# Patient Record
Sex: Male | Born: 1940
Health system: Southern US, Community
[De-identification: ages and names within clinical notes are randomized; demographics above are authoritative.]

## PROBLEM LIST (undated history)

## (undated) DIAGNOSIS — M1990 Unspecified osteoarthritis, unspecified site: Secondary | ICD-10-CM

## (undated) DIAGNOSIS — G8929 Other chronic pain: Secondary | ICD-10-CM

## (undated) DIAGNOSIS — R269 Unspecified abnormalities of gait and mobility: Secondary | ICD-10-CM

## (undated) DIAGNOSIS — I739 Peripheral vascular disease, unspecified: Secondary | ICD-10-CM

## (undated) DIAGNOSIS — I10 Essential (primary) hypertension: Secondary | ICD-10-CM

## (undated) DIAGNOSIS — E7800 Pure hypercholesterolemia, unspecified: Secondary | ICD-10-CM

## (undated) DIAGNOSIS — D510 Vitamin B12 deficiency anemia due to intrinsic factor deficiency: Secondary | ICD-10-CM

## (undated) DIAGNOSIS — M549 Dorsalgia, unspecified: Secondary | ICD-10-CM

## (undated) DIAGNOSIS — E1142 Type 2 diabetes mellitus with diabetic polyneuropathy: Secondary | ICD-10-CM

## (undated) DIAGNOSIS — N400 Enlarged prostate without lower urinary tract symptoms: Secondary | ICD-10-CM

## (undated) DIAGNOSIS — E1143 Type 2 diabetes mellitus with diabetic autonomic (poly)neuropathy: Secondary | ICD-10-CM

## (undated) HISTORY — PX: CERVICAL DISCECTOMY: SHX98

## (undated) HISTORY — DX: Dorsalgia, unspecified: M54.9

## (undated) HISTORY — DX: Vitamin B12 deficiency anemia due to intrinsic factor deficiency: D51.0

## (undated) HISTORY — DX: Unspecified abnormalities of gait and mobility: R26.9

## (undated) HISTORY — PX: TOTAL HIP ARTHROPLASTY: SHX124

## (undated) HISTORY — DX: Essential (primary) hypertension: I10

## (undated) HISTORY — DX: Pure hypercholesterolemia, unspecified: E78.00

## (undated) HISTORY — PX: THORACIC DISCECTOMY: SHX6113

## (undated) HISTORY — DX: Type 2 diabetes mellitus with diabetic autonomic (poly)neuropathy: E11.43

## (undated) HISTORY — DX: Peripheral vascular disease, unspecified: I73.9

## (undated) HISTORY — DX: Benign prostatic hyperplasia without lower urinary tract symptoms: N40.0

## (undated) HISTORY — DX: Other chronic pain: G89.29

## (undated) HISTORY — DX: Unspecified osteoarthritis, unspecified site: M19.90

## (undated) HISTORY — PX: LAMINECTOMY: SHX219

## (undated) HISTORY — PX: UMBILICAL HERNIA REPAIR: SHX196

---
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
## 1999-12-04 ENCOUNTER — Encounter: Payer: Self-pay | Admitting: Orthopedic Surgery

## 1999-12-05 ENCOUNTER — Ambulatory Visit (HOSPITAL_COMMUNITY): Admission: RE | Admit: 1999-12-05 | Discharge: 1999-12-05 | Payer: Self-pay | Admitting: Orthopedic Surgery

## 2003-06-24 ENCOUNTER — Encounter: Admission: RE | Admit: 2003-06-24 | Discharge: 2003-06-24 | Payer: Self-pay | Admitting: Orthopedic Surgery

## 2003-06-24 ENCOUNTER — Encounter: Payer: Self-pay | Admitting: Orthopedic Surgery

## 2009-05-17 ENCOUNTER — Encounter: Admission: RE | Admit: 2009-05-17 | Discharge: 2009-05-17 | Payer: Self-pay | Admitting: Orthopedic Surgery

## 2009-08-17 ENCOUNTER — Encounter: Admission: RE | Admit: 2009-08-17 | Discharge: 2009-08-17 | Payer: Self-pay | Admitting: Orthopedic Surgery

## 2010-03-16 ENCOUNTER — Encounter: Admission: RE | Admit: 2010-03-16 | Discharge: 2010-03-16 | Payer: Self-pay | Admitting: Orthopedic Surgery

## 2011-06-11 ENCOUNTER — Inpatient Hospital Stay (HOSPITAL_COMMUNITY)
Admission: RE | Admit: 2011-06-11 | Discharge: 2011-06-21 | DRG: 945 | Disposition: A | Discharge status: Home / Self Care | Payer: No Typology Code available for payment source | Source: Other Acute Inpatient Hospital | Attending: Physical Medicine & Rehabilitation | Admitting: Physical Medicine & Rehabilitation

## 2011-06-11 DIAGNOSIS — M47817 Spondylosis without myelopathy or radiculopathy, lumbosacral region: Secondary | ICD-10-CM

## 2011-06-11 DIAGNOSIS — Z79899 Other long term (current) drug therapy: Secondary | ICD-10-CM

## 2011-06-11 DIAGNOSIS — Y9210 Unspecified residential institution as the place of occurrence of the external cause: Secondary | ICD-10-CM

## 2011-06-11 DIAGNOSIS — N390 Urinary tract infection, site not specified: Secondary | ICD-10-CM

## 2011-06-11 DIAGNOSIS — M5126 Other intervertebral disc displacement, lumbar region: Secondary | ICD-10-CM

## 2011-06-11 DIAGNOSIS — R338 Other retention of urine: Secondary | ICD-10-CM

## 2011-06-11 DIAGNOSIS — M1990 Unspecified osteoarthritis, unspecified site: Secondary | ICD-10-CM

## 2011-06-11 DIAGNOSIS — K929 Disease of digestive system, unspecified: Secondary | ICD-10-CM

## 2011-06-11 DIAGNOSIS — Z809 Family history of malignant neoplasm, unspecified: Secondary | ICD-10-CM

## 2011-06-11 DIAGNOSIS — K560 Paralytic ileus: Secondary | ICD-10-CM

## 2011-06-11 DIAGNOSIS — E119 Type 2 diabetes mellitus without complications: Secondary | ICD-10-CM

## 2011-06-11 DIAGNOSIS — Z888 Allergy status to other drugs, medicaments and biological substances status: Secondary | ICD-10-CM

## 2011-06-11 DIAGNOSIS — IMO0002 Reserved for concepts with insufficient information to code with codable children: Secondary | ICD-10-CM

## 2011-06-11 DIAGNOSIS — G8929 Other chronic pain: Secondary | ICD-10-CM

## 2011-06-11 DIAGNOSIS — B9689 Other specified bacterial agents as the cause of diseases classified elsewhere: Secondary | ICD-10-CM

## 2011-06-11 DIAGNOSIS — M4120 Other idiopathic scoliosis, site unspecified: Secondary | ICD-10-CM

## 2011-06-11 DIAGNOSIS — Z96649 Presence of unspecified artificial hip joint: Secondary | ICD-10-CM

## 2011-06-11 DIAGNOSIS — Z5189 Encounter for other specified aftercare: Principal | ICD-10-CM

## 2011-06-11 DIAGNOSIS — Y838 Other surgical procedures as the cause of abnormal reaction of the patient, or of later complication, without mention of misadventure at the time of the procedure: Secondary | ICD-10-CM

## 2011-06-11 DIAGNOSIS — D62 Acute posthemorrhagic anemia: Secondary | ICD-10-CM

## 2011-06-11 DIAGNOSIS — Z823 Family history of stroke: Secondary | ICD-10-CM

## 2011-06-11 DIAGNOSIS — G5790 Unspecified mononeuropathy of unspecified lower limb: Secondary | ICD-10-CM

## 2011-06-12 ENCOUNTER — Inpatient Hospital Stay (HOSPITAL_COMMUNITY): Payer: No Typology Code available for payment source

## 2011-06-12 DIAGNOSIS — M47817 Spondylosis without myelopathy or radiculopathy, lumbosacral region: Secondary | ICD-10-CM

## 2011-06-12 DIAGNOSIS — IMO0002 Reserved for concepts with insufficient information to code with codable children: Secondary | ICD-10-CM

## 2011-06-12 LAB — CBC
HCT: 27.7 % — ABNORMAL LOW (ref 39.0–52.0)
Hemoglobin: 9.1 g/dL — ABNORMAL LOW (ref 13.0–17.0)
MCH: 28 pg (ref 26.0–34.0)
MCV: 85.2 fL (ref 78.0–100.0)
Platelets: 403 10*3/uL — ABNORMAL HIGH (ref 150–400)
RBC: 3.25 MIL/uL — ABNORMAL LOW (ref 4.22–5.81)
WBC: 7.9 10*3/uL (ref 4.0–10.5)

## 2011-06-12 LAB — DIFFERENTIAL
Eosinophils Absolute: 0.4 10*3/uL (ref 0.0–0.7)
Lymphocytes Relative: 13 % (ref 12–46)
Lymphs Abs: 1 10*3/uL (ref 0.7–4.0)
Monocytes Relative: 10 % (ref 3–12)
Neutro Abs: 5.7 10*3/uL (ref 1.7–7.7)
Neutrophils Relative %: 72 % (ref 43–77)

## 2011-06-12 LAB — COMPREHENSIVE METABOLIC PANEL
ALT: 28 U/L (ref 0–53)
Alkaline Phosphatase: 71 U/L (ref 39–117)
BUN: 4 mg/dL — ABNORMAL LOW (ref 6–23)
Chloride: 106 mEq/L (ref 96–112)
GFR calc Af Amer: >60 mL/min (ref >60)
Glucose, Bld: 95 mg/dL (ref 70–99)
Potassium: 3.8 mEq/L (ref 3.5–5.1)
Sodium: 139 mEq/L (ref 135–145)
Total Bilirubin: 0.4 mg/dL (ref 0.3–1.2)
Total Protein: 4.9 g/dL — ABNORMAL LOW (ref 6.0–8.3)

## 2011-06-12 LAB — GLUCOSE, CAPILLARY
Glucose-Capillary: 112 mg/dL — ABNORMAL HIGH (ref 70–99)
Glucose-Capillary: 118 mg/dL — ABNORMAL HIGH (ref 70–99)

## 2011-06-13 LAB — GLUCOSE, CAPILLARY
Glucose-Capillary: 102 mg/dL — ABNORMAL HIGH (ref 70–99)
Glucose-Capillary: 104 mg/dL — ABNORMAL HIGH (ref 70–99)
Glucose-Capillary: 116 mg/dL — ABNORMAL HIGH (ref 70–99)
Glucose-Capillary: 146 mg/dL — ABNORMAL HIGH (ref 70–99)

## 2011-06-14 DIAGNOSIS — M47817 Spondylosis without myelopathy or radiculopathy, lumbosacral region: Secondary | ICD-10-CM

## 2011-06-14 DIAGNOSIS — IMO0002 Reserved for concepts with insufficient information to code with codable children: Secondary | ICD-10-CM

## 2011-06-14 LAB — GLUCOSE, CAPILLARY
Glucose-Capillary: 92 mg/dL (ref 70–99)
Glucose-Capillary: 273 mg/dL — ABNORMAL HIGH (ref 70–99)

## 2011-06-14 NOTE — H&P (Signed)
NAMEANDREA, COLGLAZIER NO.:  [id number]  MEDICAL RECORD NO.:  [id number]  LOCATION:  4037                         FACILITY:  [facility]  PHYSICIAN:  Ranelle Oyster, M.D.DATE OF BIRTH:  [birth year]  DATE OF ADMISSION:  06/11/2011 DATE OF DISCHARGE:                             HISTORY & PHYSICAL   CHIEF COMPLAINTS:  Lower extremity weakness, back pain.  PRIMARY CARE PROVIDER:  Shea Evans. Leonor Liv, MD  HISTORY OF PRESENT ILLNESS:  This is a 70 year old white male diabetes, recurrent lumbar stenosis and scoliosis L3-S1 with disk herniation L3- L4, L4-L5, and L5-S1 with severe pain and inability to ambulate.  He underwent a lumbar re-exploration, decompression and diskectomies of L2- L3 through L5-S1, followed by fusion by Dr. Wyline Mood on June 04, 2011.  Postoperatively, the patient developed diffuse colonic ileus with abdominal pain, nausea and vomiting.  He was n.p.o. through June 10, 2011.  He did have a bowel movement and diet was initiated on June 10, 2011.  He had problems with urinary retention as well and he failed his voiding trial and a Foley catheter was replaced today. Pain is improving but the patient still has significant difficulties with mobility, balance, etc.  Rehab was consulted and felt he could benefit from inpatient stay.  REVIEW OF SYSTEMS:  Notable for insomnia, numbness in right foot, although this is intermittent and improving, urine retention and wound issues.  Full 12-point review is in the written health and history section of the chart.  The patient does report positive bowel movement and flatus today.  PAST MEDICAL HISTORY:  Positive for: 1. Diabetes. 2. OA. 3. Back surgery 1994 and 1996. 4. Right total hip replacement. 5. Left knee scope. 6. Chronic low back pain with radiculopathy for a few years now.  FAMILY HISTORY:  Positive for cancer and stroke.  SOCIAL HISTORY:  The patient is married and independent prior to  arrival until a month ago.  He lives in 1-level house with 1 step to enter.  He does not smoke or drink.  ALLERGIES:  VICODIN, CODEINE and OPIOIDS.  HOME MEDICATIONS:  Glipizide, metformin, Robaxin, multivitamin, and calcium citrate.  LABORATORY DATA:  Hemoglobin 9.6, white count 11.9.  Sodium 138, potassium 3.7, BUN 5, creatinine 0.54.  PHYSICAL EXAMINATION:  VITAL SIGNS:  Blood pressure is 135/67, pulse 77, respiratory rate 18. GENERA:  The patient is pleasant, alert, lying on his side. HEENT:  Pupils are equal, round, and reactive to light.  Ear, nose and throat exam notable for dry mucosa, wearing full dentures on top and partial on the bottom.  NECK:  Supple without JVD or lymphadenopathy. CHEST:  Clear to auscultation bilaterally without wheezes, rales or rhonchi. HEART:  Regular rate and rhythm without murmur, rubs or gallop. EXTREMITIES:  No clubbing, cyanosis or edema. ABDOMEN:  Mild abdominal distention with decreased bowel sounds.  He is minimally tender. SKIN:  Generally intact throughout.  His low back incision which was well approximated with Steri-Strips. NEUROLOGIC:  Cranial nerves II-XII were normal.  Reflexes 1+.  He has generally intact sensation in all four limbs.  There may have been some diminishment of the distal right foot over the toes but  this is debatable.  Judgment, orientation, memory and mood are all within functional limits.  Strength is 4+-5/5 upper extremities.  Lower extremities 2/5 hip flexion, 3/5 knee flexion, hip abduction and adduction.  Ankle dorsiflexion and plantar flexion 4/5.  POST ADMISSION PHYSICIAN EVALUATION: 1. Functional deficit secondary to lumbar HNP and stenosis with     scoliosis status post decompression, diskectomy and fusion at L2-     S1, postoperative day #7 today. 2. The patient is admitted to receive collaborative interdisciplinary     care between the physiatrist, rehab nursing staff, and therapy     team. 3. The  patient's level of medical complexity and substantial therapy     needs in context of that medical necessity cannot be provided at a     lesser intensity of care. 4. The patient has experienced substantial functional loss from his     baseline.  Premorbidly, up to about a month ago he was independent.     More recently, he would need some assistance due to increasing pain     and falls.  Currently, he is min assist oral hygiene, working on     balance edge of bed.  He is min assist for transfers.  He is     contact guard to min assist ambulating up to 20 feet only so far.     He has problems with posture and speech.  He has been using rolling     walker.  Judging by the patient's diagnosis, physical exam, and     functional history, he has a potential for functional progress     which will result in measurable gains while in inpatient rehab.     These gains will be of substantial and practical use upon discharge     to home in facilitating mobility and self-care. 5. The physiatrist will provide 24-hour management of medical needs as     well as oversight of therapy plan/treatment and provide guidance as     appropriate regarding interactions of the two.  Medical problem     list and plan are below. 6. A 24-hour rehab nursing team will assist in the management of the     patient's skin care needs as well as pain, bowel and bladder     function, integration of therapy concepts and techniques. 7. PT will assess and treat for lower extremity strength, range of     motion, functional mobility, gait, adaptive techniques, equipment,     donning and doffing of brace.  Goals modified independent to min     assist. 8. OT will assess and treat for upper extremity use ADLs, adaptive     techniques, equipment, functional ability for donning and doffing     of brace, back precautions with goals modified independent to min     assist. 9. Case management and social worker will assess and treat for      psychosocial issues and discharge plan. 10.Team conference will be held weekly to assess progress towards     goals and to determine barriers at discharge. 11.The patient demonstrated sufficient medical stability and exercise     capacity to tolerate at least 3 hours of therapy per day at least 5     days per week. 12.Estimated length of stay is approximately 8-10 days.  Prognosis is     good.  MEDICAL PROBLEM LIST AND PLAN: 1. DVT prophylaxis with subcu Lovenox.  Follow for any bleeding     complications,  and check platelets counts etc. 2. Pain management p.r.n. oxycodone.  This seems to be effective thus     far despite the patient's intolerance of "opioids."  Consider     scheduling oxycodone depending upon pain tolerance in knee. 3. Type 2 diabetes:  Check CBGs before meals and at bedtime.  Continue     metformin, glipizide with sliding-scale insulin for elevation.     Ensure appropriate intake given his recent ileus and dietary     restrictions. 4. Acute blood loss anemia:  Monitor H and H as above.  We will     recheck CBC in the morning.  We will hold iron due to his ileus and     nausea. 5. Ileus:  This is resolving although the patient still has a     distention on exam.  Increase laxatives.  Schedule suppository     q.a.m. unless having spontaneous movements.  Consider followup KUB     as well. 6. Urine retention:  Check UA C and S.  Foley catheter was replaced     today.  We will continue with Foley until mobility improves and     ileus shows further resolution as well.  The denied any significant     history of any bladder problems.  Certainly, his diabetes is     helping the picture here.     Ranelle Oyster, M.D.     ZTS/MEDQ  D:  06/11/2011  T:  06/12/2011  Job:  [id number]  cc:   Shea Evans. Leonor Liv, MD Donette Larry, MD  Electronically Signed by Faith Rogue M.D. on 06/14/2011 10:15:47 AM

## 2011-06-15 LAB — GLUCOSE, CAPILLARY
Glucose-Capillary: 263 mg/dL — ABNORMAL HIGH (ref 70–99)
Glucose-Capillary: 134 mg/dL — ABNORMAL HIGH (ref 70–99)

## 2011-06-16 LAB — GLUCOSE, CAPILLARY
Glucose-Capillary: 84 mg/dL (ref 70–99)
Glucose-Capillary: 232 mg/dL — ABNORMAL HIGH (ref 70–99)

## 2011-06-17 LAB — URINALYSIS, ROUTINE W REFLEX MICROSCOPIC
Bilirubin Urine: NEGATIVE
Hgb urine dipstick: NEGATIVE
Nitrite: NEGATIVE
Protein, ur: NEGATIVE mg/dL
Specific Gravity, Urine: 1.009 (ref 1.005–1.030)
Urobilinogen, UA: 1 mg/dL (ref 0.0–1.0)

## 2011-06-17 LAB — GLUCOSE, CAPILLARY
Glucose-Capillary: 77 mg/dL (ref 70–99)
Glucose-Capillary: 92 mg/dL (ref 70–99)

## 2011-06-18 LAB — GLUCOSE, CAPILLARY
Glucose-Capillary: 188 mg/dL — ABNORMAL HIGH (ref 70–99)
Glucose-Capillary: 189 mg/dL — ABNORMAL HIGH (ref 70–99)

## 2011-06-19 LAB — URINE CULTURE
Colony Count: >=100000
Culture  Setup Time: 201209232102
Special Requests: NEGATIVE

## 2011-06-19 LAB — GLUCOSE, CAPILLARY
Glucose-Capillary: 111 mg/dL — ABNORMAL HIGH (ref 70–99)
Glucose-Capillary: 168 mg/dL — ABNORMAL HIGH (ref 70–99)
Glucose-Capillary: 181 mg/dL — ABNORMAL HIGH (ref 70–99)

## 2011-06-20 ENCOUNTER — Inpatient Hospital Stay (HOSPITAL_COMMUNITY): Payer: No Typology Code available for payment source

## 2011-06-20 LAB — GLUCOSE, CAPILLARY
Glucose-Capillary: 107 mg/dL — ABNORMAL HIGH (ref 70–99)
Glucose-Capillary: 152 mg/dL — ABNORMAL HIGH (ref 70–99)
Glucose-Capillary: 111 mg/dL — ABNORMAL HIGH (ref 70–99)

## 2011-06-21 DIAGNOSIS — IMO0002 Reserved for concepts with insufficient information to code with codable children: Secondary | ICD-10-CM

## 2011-06-21 DIAGNOSIS — M47817 Spondylosis without myelopathy or radiculopathy, lumbosacral region: Secondary | ICD-10-CM

## 2011-06-22 LAB — GLUCOSE, CAPILLARY: Glucose-Capillary: 191 mg/dL — ABNORMAL HIGH (ref 70–99)

## 2011-07-02 NOTE — Discharge Summary (Signed)
Walter [name], BANAS NO.:  [id number]  MEDICAL RECORD NO.:  [id number]  LOCATION:  4037                         FACILITY:  [facility]  PHYSICIAN:  Erick Colace, M.D.DATE OF BIRTH:  07/25/1941  DATE OF ADMISSION:  06/11/2011 DATE OF DISCHARGE:  06/21/2011                              DISCHARGE SUMMARY   DISCHARGE DIAGNOSES: 1. Lumbar stenosis with radiculopathy status post decompression L2-S1     with fusion. 2. Postop ileus, slowly resolving. 3. Postop urinary retention continuous. 4. Diabetes mellitus type 2. 5. Acute blood loss anemia.  HISTORY OF PRESENT ILLNESS:  Walter [name] is a 70 year old male with history of diabetes mellitus, recurrent lumbar stenosis with scoliosis L3-S1 with disk herniation L3-L4, L4-L5, L5-S1 with severe pain and decreased ability to ambulate for the past few months.  The patient elected to undergo lumbar re-exploration with decompression and diskectomy L2-S1 followed by fusion by Dr. Wyline Mood on June 04, 2011. Postop has had issues with diffuse colonic ileus with abdominal pain and nausea, vomiting.  He was n.p.o. through June 09, 2011.  Has positive BM, passed laxatives.  Diet was initiated on June 10, 2011.  The patient has also had issues with urinary retention requiring voiding trial x2.  He has failed this and Foley has been replaced.  The patient was evaluated by rehab team and we felt that he would benefit from inpatient rehab program.  PAST MEDICAL HISTORY:  Significant for, 1. DM type 2. 2. OA. 3. Lumbar decompression in 1994 and 1996. 4. Right total hip replacement. 5. Left knee scope. 6. Low back pain with radiculopathy for the past few years.  ALLERGIES:  VICODIN, IODINE, and OPIOIDS.  REVIEW OF SYMPTOMS:  Positive for abdominal distention and pain, urinary retention as well as back pain.  FAMILY HISTORY:  Positive for cancer and CVA.  SOCIAL HISTORY:  The patient is married, was  independent until 1 month ago.  Lives in 1-level home with one-step at entry.  Does not use any tobacco or alcohol.  Wife is supportive and can assist past discharge.  FUNCTIONAL HISTORY:  The patient was independent until 1 month prior to admission, since then he has required standby to min assist for ambulation due to falls and lower extremity instability.  FUNCTIONAL STATUS:  The patient is min assist.  Oral hygiene.  Working on balance at edge of bed.  Min assist transfers.  Contact guard to min assist for ambulating up to 20 feet with rolling walker.  Noted to have decreased of speed and decreased posture.  PHYSICAL EXAMINATION:  VITALS:  Blood pressure 135/67, pulse 77, respiratory rate 18. GENERAL:  The patient is a pleasant male, alert, oriented in no acute distress. HEENT:  Pupils equal, round, and reactive to light.  Oral mucosa is noted to be dry.  Full set of dentures in place on top and partials on bottom.  Hearing intact. NECK:  Supple without JVD or lymphadenopathy. LUNGS:  Clear to auscultation bilaterally without wheezes, rales, or rhonchi. HEART:  Regular rate and rhythm without murmurs, gallops, or rubs. ABDOMEN:  Distended with decreased bowel sounds and minimally tender. EXTREMITIES:  No clubbing, cyanosis, or edema. SKIN:  Low back incision is well approximated with Steri-Strips in place.  No drainage.  No erythema. NEUROLOGIC:  Cranial nerves II through XII within normal limits. Reflexes 1+.  The patient with intact sensation in all four limbs.  May have been some diminishment to distal right foot, but this is debatable. Judgment, orientation, and memory, and mood are within functional limits.  Strength is 4+ to 5-/5 in upper extremity.  Lower extremities 2/5.  Hip flexion 3/5.  Knee flexion, hip abduction, adduction, ankle dorsiflexion, plantar flexion are 4/5.  HOSPITAL COURSE:  Walter [name] was admitted to rehab on June 11, 2011, for inpatient  therapies to consist of PT, OT at least 3 hours 5 days a week.  Past admission, the patient was started on aggressive bowel program to help with his abdominal distention and ileus symptoms. P.o. intake was monitored and rehab RN has worked with the patient on bowel and bladder program as well as pain management issues.  The patient's blood pressures were checked on b.i.d. basis and these were reasonably controlled ranging from 110s-120s systolic, 60s to 40J diastolic.  The patient's CBGs were checked on before meals and nightly basis.  P.o. intake has slowly been improving and blood sugars are currently ranging from 110s to occasional high 150s.  The patient with continued issues of abdominal pain and discomfort, therefore KUB was done on June 12, 2011, showing mild gaseous distention, possibly reflecting adynamic ileus.  The patient was started on Dulcolax suppository b.i.d. as well as MiraLax, which was slowly increased to t.i.d. basis.  The patient has had some issues with incontinence due to poor sensation around rectally.  He was noted to have diarrhea and bowels are currently showing improvement and are semi-soft.  Followup KUB of June 20, 2011, shows slight improvement in bowel gas pattern with probable resolving postop ileus.  The patient's wife has been educated regarding bowel program.  He is to continue on MiraLax daily with suppositories every other day.  He is advised to increase MiraLax to t.i.d. basis with no BM in 24 hours.  Pain management was initiated with addition of Duragesic patch as well as lidocaine patches to low back areas to help with pain.  At the time of discharge, overall pain is greatly improved and the patient is to taper off fentanyl in the next two weeks.  A voiding trial was initiated on June 16, 2011, the patient was noted to have recurrent urinary retention.  He has not had the urge to void, overflowed and been unable to void.  He  was started on Urecholine without much improvement in his symptomatology. UA/UC was sent off and this shows greater than 100,000 colonies of Enterobacter aerogenes and Enterobacter cloacae.  He was started on Cipro for treatment of his UTI.  Discussion was undertaken with the patient regarding education for in-and-out caths past discharge as opposed to indwelling Foley for now and follow up with Urology for voiding trial in the future.  The patient has elected on having a Foley placed and this was done.  He is to follow up with Dr. Saddie Benders, Urology for further workup.  During the patient's stay in rehab, weekly team conferences were held to monitor the patient's progress, set goals as well as discuss barriers to discharge.  At the time of admission, the patient was limited by weakness, right greater than left lower extremity with sensory deficits, decrease in balance reaction, as well as pain issues impacting his overall mobility.  He was  initially at [facility] assist for transfers, total assist plus 2 for standing.  He has progressed to being at supervision level for transfers, supervision level for ambulating 150 feet with rolling walker.  He continues to be limited by decreased activity tolerance, low back pain, as well as a his neurogenic bowel and bladder. His wife has been educated about providing supervision for all transfers and mobility.  Further followup home health physical therapy to continue past discharge.  Occupational Therapy has worked with the patient on self-care tasks.  At admission, the patient required mod assist for sit to stand transfers.  He was limited by decreased standing tolerance due to the pain.  The patient has currently progressed to being at modified independent with bathing and dressing with Adaptic equipment to assist lower body dressing.  He does continue to have issues with incontinence requiring assistance for hygiene.  Wife is to provide assistance  as needed with self-care tasks.  No further OT needs noted.  On June 21, 2011, the patient is discharged to home.  DISCHARGE MEDICATIONS: 1. Tylenol 325-650 mg q.4 h. p.r.n. pain. 2. Cipro 250 mg p.o. b.i.d. 3. Dulcolax suppository one every other day until bowel movements     normalize. 4. Pepcid 20 mg b.i.d. p.r.n. 5. Fentanyl patch 12 mcg an hour change q.3 days, one box Rx. 6. Lidocaine patches one to each hip on at 8 a.m. off 8 p.m. daily. 7. OxyIR 5 mg one to two p.o. q.6 h. p.r.n. mod to severe pain, #75     Rx. 8. MiraLax 17 g in 8 ounces p.o. per day. 9. Simethicone 80 mg p.o. t.i.d. before meals and nightly p.r.n.     bloating. 10.Flomax 0.4 mg p.o. at bedtime. 11.Ultram 50 mg p.o. t.i.d. x1 week then decrease to one p.o. b.i.d.     x1 week then taper to p.r.n. basis. 12.Glipizide - metformin 5/500 one p.o. b.i.d. 13.Calcium supplements per day. 14.Robaxin 500 mg p.o. q.i.d. p.r.n. spasms. 15.Multivitamin one per day.  Activity level is at 24-hour supervision.  No strenuous activity.  SPECIAL INSTRUCTIONS:  No alcohol, no driving.  Increase MiraLax 2-3 times a day if no BM in 24 hours.  Outpatient physical therapy at [facility].  They will contact you with date and time for appointment.  DIET:  Diabetic.  FOLLOWUP:  The patient to follow up with Dr. Wynn Banker as needed.Follow up with Dr. Wyline Mood for postop check.  Follow up with Dr. Lillia Abed Hold in 2 weeks.  Follow up with Dr. Saddie Benders, Urology June 26, 2011, at 2:30.     Delle Reining, P.A.   ______________________________ Erick Colace, M.D.    PL/MEDQ  D:  06/21/2011  T:  06/22/2011  Job:  [id number]  cc:   Despina Hidden, M.D.  Electronically Signed by Osvaldo Shipper. on 06/26/2011 02:18:09 PM Electronically Signed by Claudette Laws M.D. on 07/02/2011 01:12:41 PM

## 2013-08-17 IMAGING — CR DG ABDOMEN 1V
2 series · 2 of 2 positions shown · non-contrast
Comparison: None.

CLINICAL DATA: Postop spine surgery, ileus

ABDOMEN - 1 VIEW

[t abdomen supine (1 of 2)]
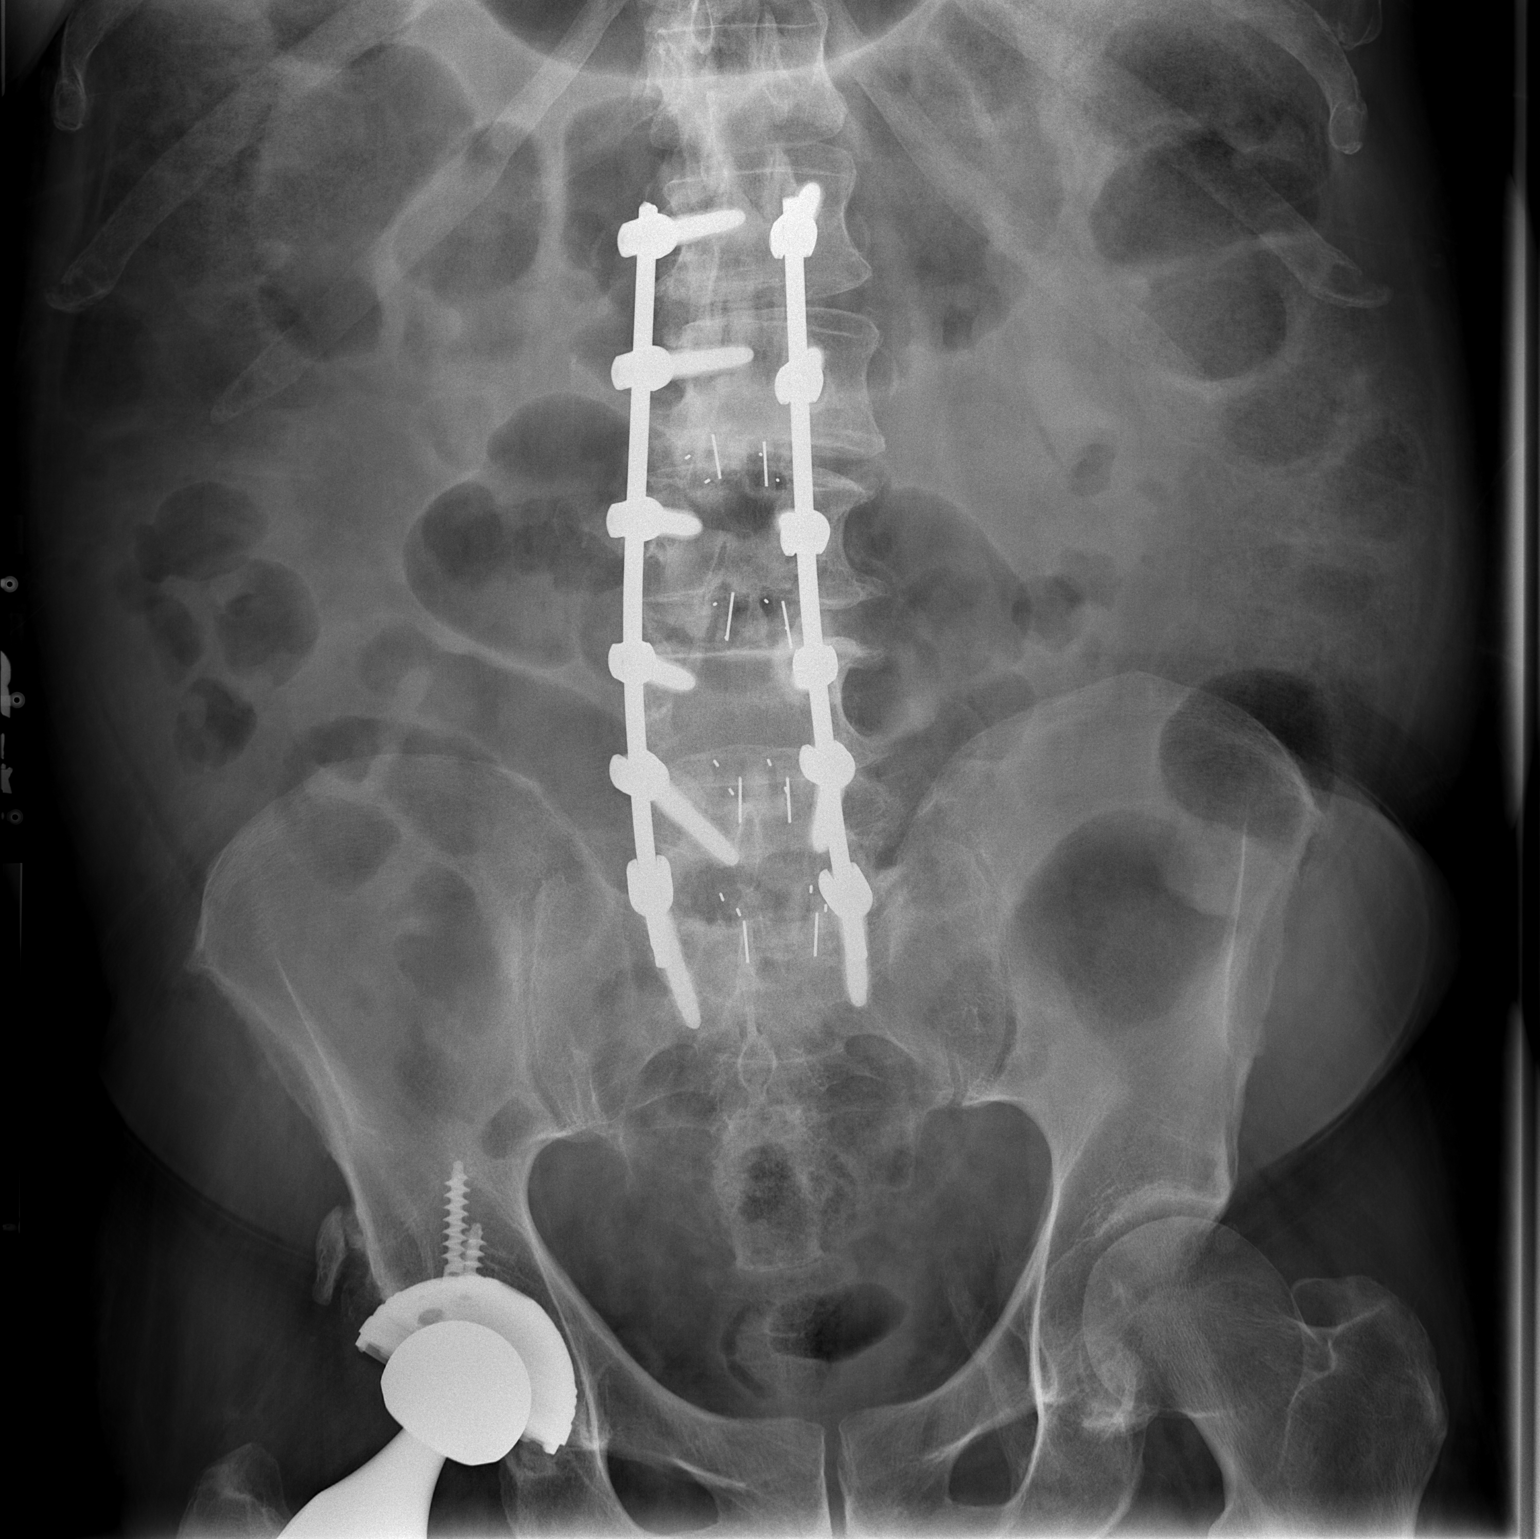

[t abdomen supine (2 of 2)]
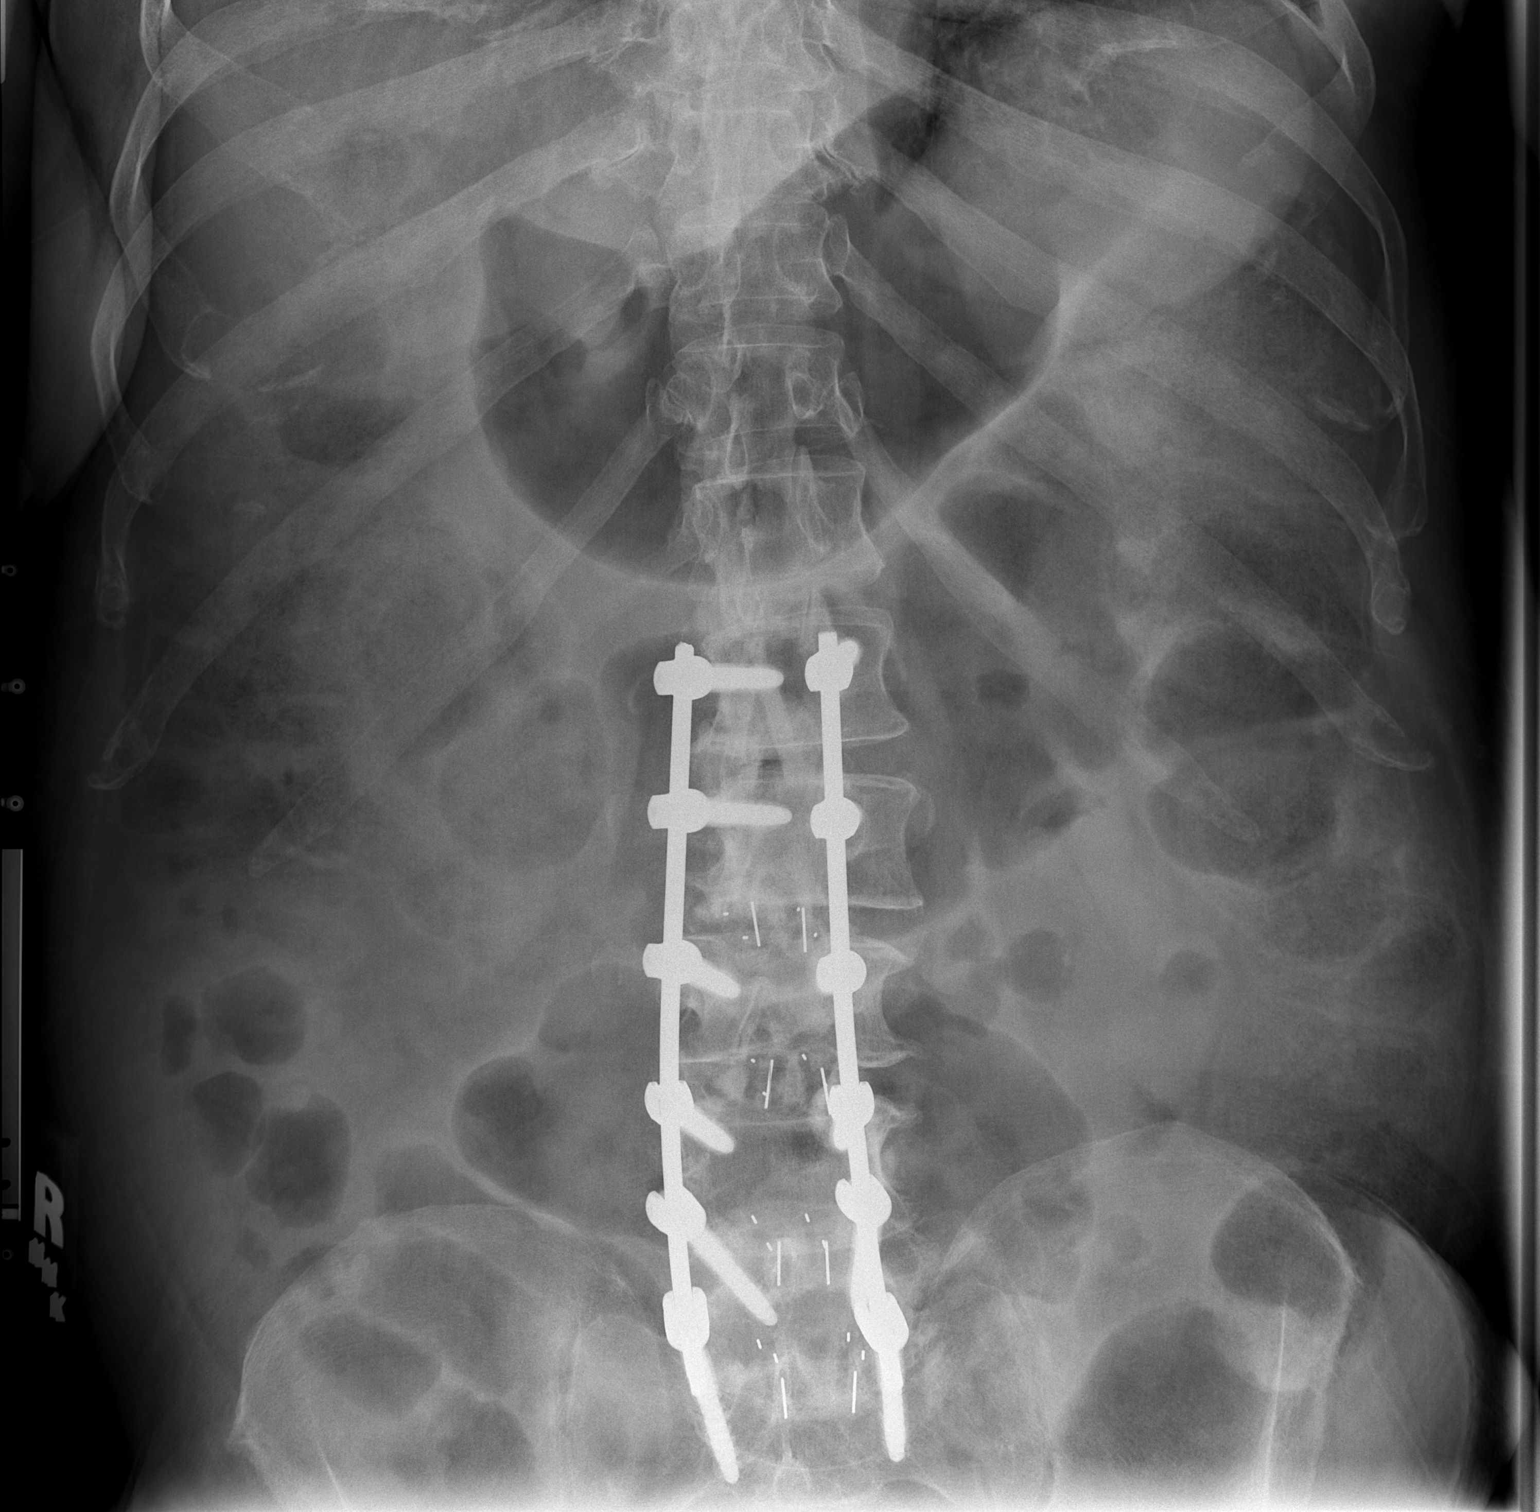

[2 of 2 positions shown; findings below may reference images not displayed]

FINDINGS: Postsurgical changes from prior lumbar spine fixation
from L1-S1.

Nonobstructive bowel gas pattern. Mild gaseous distension of the
colon, possibly reflecting adynamic ileus.

Right hip arthroplasty.
IMPRESSION: Possible adynamic colonic ileus.  No evidence of bowel obstruction.

## 2013-08-25 IMAGING — CR DG ABDOMEN 1V
2 series · 2 of 2 positions shown · non-contrast
Comparison: 06/12/2011.

CLINICAL DATA: Abdominal distention.  Status post lumbar surgery.

ABDOMEN - 1 VIEW

[t abdomen supine (1 of 2)]
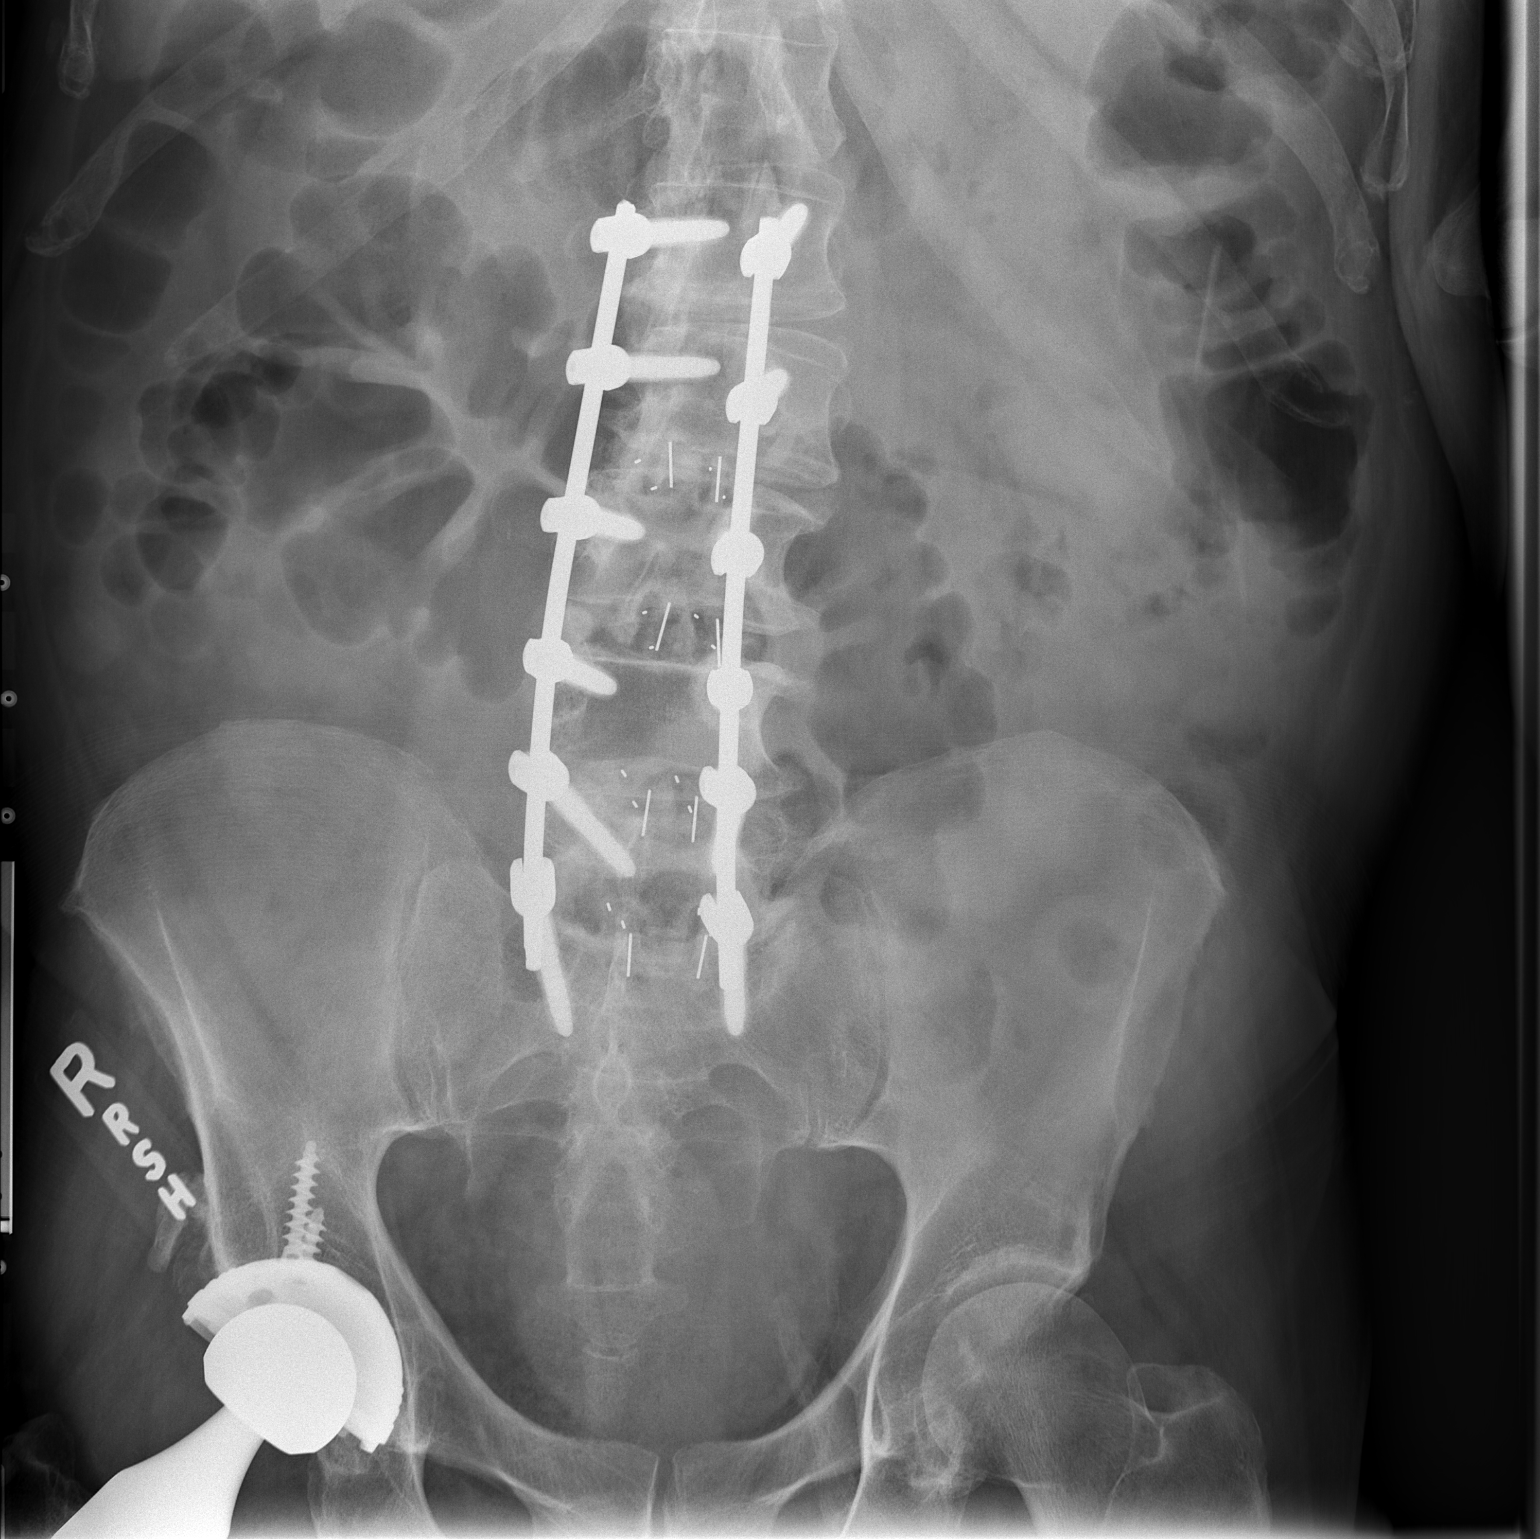

[t abdomen supine (2 of 2)]
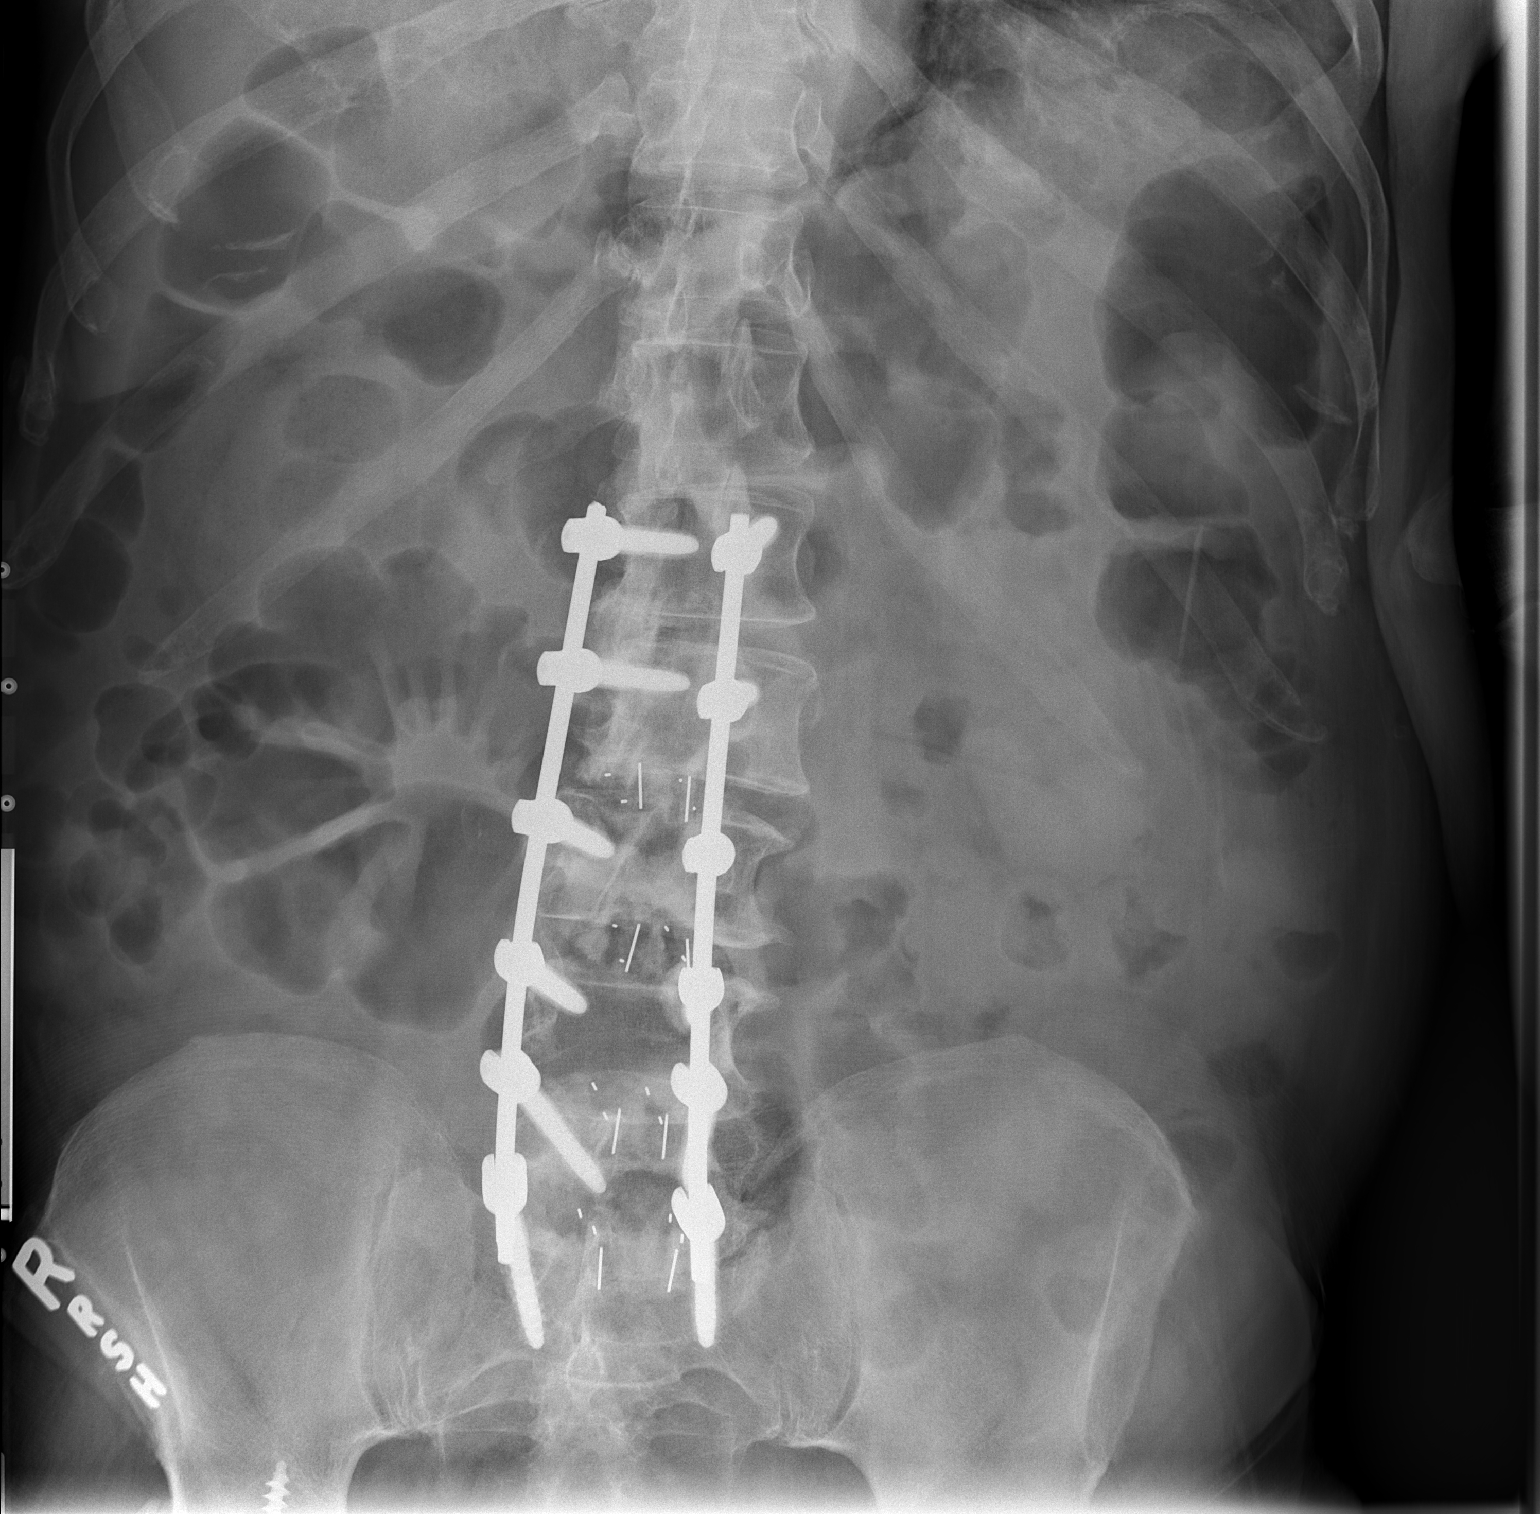

[2 of 2 positions shown; findings below may reference images not displayed]

FINDINGS: The lumbar fusion hardware is stable.  There is a mild
diffuse ileus bowel gas pattern which is slightly improved.  No
free air.
IMPRESSION: Slight improved bowel gas pattern with probable resolving
postoperative ileus.

## 2015-10-10 DIAGNOSIS — D510 Vitamin B12 deficiency anemia due to intrinsic factor deficiency: Secondary | ICD-10-CM | POA: Diagnosis not present

## 2015-11-02 DIAGNOSIS — M5410 Radiculopathy, site unspecified: Secondary | ICD-10-CM | POA: Diagnosis not present

## 2015-11-02 DIAGNOSIS — R200 Anesthesia of skin: Secondary | ICD-10-CM | POA: Diagnosis not present

## 2015-11-02 DIAGNOSIS — Z981 Arthrodesis status: Secondary | ICD-10-CM | POA: Diagnosis not present

## 2015-11-02 DIAGNOSIS — Z4789 Encounter for other orthopedic aftercare: Secondary | ICD-10-CM | POA: Diagnosis not present

## 2015-11-02 DIAGNOSIS — G959 Disease of spinal cord, unspecified: Secondary | ICD-10-CM | POA: Diagnosis not present

## 2015-11-04 DIAGNOSIS — M6281 Muscle weakness (generalized): Secondary | ICD-10-CM | POA: Diagnosis not present

## 2015-11-04 DIAGNOSIS — R262 Difficulty in walking, not elsewhere classified: Secondary | ICD-10-CM | POA: Diagnosis not present

## 2015-11-04 DIAGNOSIS — M62552 Muscle wasting and atrophy, not elsewhere classified, left thigh: Secondary | ICD-10-CM | POA: Diagnosis not present

## 2015-11-08 DIAGNOSIS — R262 Difficulty in walking, not elsewhere classified: Secondary | ICD-10-CM | POA: Diagnosis not present

## 2015-11-08 DIAGNOSIS — M62552 Muscle wasting and atrophy, not elsewhere classified, left thigh: Secondary | ICD-10-CM | POA: Diagnosis not present

## 2015-11-08 DIAGNOSIS — M6281 Muscle weakness (generalized): Secondary | ICD-10-CM | POA: Diagnosis not present

## 2015-11-09 DIAGNOSIS — M6281 Muscle weakness (generalized): Secondary | ICD-10-CM | POA: Diagnosis not present

## 2015-11-09 DIAGNOSIS — M62552 Muscle wasting and atrophy, not elsewhere classified, left thigh: Secondary | ICD-10-CM | POA: Diagnosis not present

## 2015-11-09 DIAGNOSIS — R262 Difficulty in walking, not elsewhere classified: Secondary | ICD-10-CM | POA: Diagnosis not present

## 2015-11-11 DIAGNOSIS — N3020 Other chronic cystitis without hematuria: Secondary | ICD-10-CM | POA: Diagnosis not present

## 2015-11-11 DIAGNOSIS — N401 Enlarged prostate with lower urinary tract symptoms: Secondary | ICD-10-CM | POA: Diagnosis not present

## 2015-11-11 DIAGNOSIS — R338 Other retention of urine: Secondary | ICD-10-CM | POA: Diagnosis not present

## 2015-11-11 DIAGNOSIS — M62552 Muscle wasting and atrophy, not elsewhere classified, left thigh: Secondary | ICD-10-CM | POA: Diagnosis not present

## 2015-11-11 DIAGNOSIS — R262 Difficulty in walking, not elsewhere classified: Secondary | ICD-10-CM | POA: Diagnosis not present

## 2015-11-11 DIAGNOSIS — M6281 Muscle weakness (generalized): Secondary | ICD-10-CM | POA: Diagnosis not present

## 2015-11-11 DIAGNOSIS — N5203 Combined arterial insufficiency and corporo-venous occlusive erectile dysfunction: Secondary | ICD-10-CM | POA: Diagnosis not present

## 2015-11-11 DIAGNOSIS — N318 Other neuromuscular dysfunction of bladder: Secondary | ICD-10-CM | POA: Diagnosis not present

## 2015-11-11 DIAGNOSIS — D510 Vitamin B12 deficiency anemia due to intrinsic factor deficiency: Secondary | ICD-10-CM | POA: Diagnosis not present

## 2015-11-14 DIAGNOSIS — R262 Difficulty in walking, not elsewhere classified: Secondary | ICD-10-CM | POA: Diagnosis not present

## 2015-11-14 DIAGNOSIS — M62552 Muscle wasting and atrophy, not elsewhere classified, left thigh: Secondary | ICD-10-CM | POA: Diagnosis not present

## 2015-11-14 DIAGNOSIS — M6281 Muscle weakness (generalized): Secondary | ICD-10-CM | POA: Diagnosis not present

## 2015-11-16 DIAGNOSIS — M6281 Muscle weakness (generalized): Secondary | ICD-10-CM | POA: Diagnosis not present

## 2015-11-16 DIAGNOSIS — R262 Difficulty in walking, not elsewhere classified: Secondary | ICD-10-CM | POA: Diagnosis not present

## 2015-11-16 DIAGNOSIS — M62552 Muscle wasting and atrophy, not elsewhere classified, left thigh: Secondary | ICD-10-CM | POA: Diagnosis not present

## 2015-11-18 DIAGNOSIS — M62552 Muscle wasting and atrophy, not elsewhere classified, left thigh: Secondary | ICD-10-CM | POA: Diagnosis not present

## 2015-11-18 DIAGNOSIS — M6281 Muscle weakness (generalized): Secondary | ICD-10-CM | POA: Diagnosis not present

## 2015-11-18 DIAGNOSIS — R262 Difficulty in walking, not elsewhere classified: Secondary | ICD-10-CM | POA: Diagnosis not present

## 2015-11-21 DIAGNOSIS — M6281 Muscle weakness (generalized): Secondary | ICD-10-CM | POA: Diagnosis not present

## 2015-11-21 DIAGNOSIS — M62552 Muscle wasting and atrophy, not elsewhere classified, left thigh: Secondary | ICD-10-CM | POA: Diagnosis not present

## 2015-11-21 DIAGNOSIS — R262 Difficulty in walking, not elsewhere classified: Secondary | ICD-10-CM | POA: Diagnosis not present

## 2015-11-23 DIAGNOSIS — R262 Difficulty in walking, not elsewhere classified: Secondary | ICD-10-CM | POA: Diagnosis not present

## 2015-11-23 DIAGNOSIS — M6281 Muscle weakness (generalized): Secondary | ICD-10-CM | POA: Diagnosis not present

## 2015-11-23 DIAGNOSIS — M62552 Muscle wasting and atrophy, not elsewhere classified, left thigh: Secondary | ICD-10-CM | POA: Diagnosis not present

## 2015-11-25 DIAGNOSIS — M62552 Muscle wasting and atrophy, not elsewhere classified, left thigh: Secondary | ICD-10-CM | POA: Diagnosis not present

## 2015-11-25 DIAGNOSIS — R262 Difficulty in walking, not elsewhere classified: Secondary | ICD-10-CM | POA: Diagnosis not present

## 2015-11-25 DIAGNOSIS — M6281 Muscle weakness (generalized): Secondary | ICD-10-CM | POA: Diagnosis not present

## 2015-11-30 DIAGNOSIS — R262 Difficulty in walking, not elsewhere classified: Secondary | ICD-10-CM | POA: Diagnosis not present

## 2015-11-30 DIAGNOSIS — M6281 Muscle weakness (generalized): Secondary | ICD-10-CM | POA: Diagnosis not present

## 2015-11-30 DIAGNOSIS — M62552 Muscle wasting and atrophy, not elsewhere classified, left thigh: Secondary | ICD-10-CM | POA: Diagnosis not present

## 2015-12-02 DIAGNOSIS — M62552 Muscle wasting and atrophy, not elsewhere classified, left thigh: Secondary | ICD-10-CM | POA: Diagnosis not present

## 2015-12-02 DIAGNOSIS — R262 Difficulty in walking, not elsewhere classified: Secondary | ICD-10-CM | POA: Diagnosis not present

## 2015-12-02 DIAGNOSIS — M6281 Muscle weakness (generalized): Secondary | ICD-10-CM | POA: Diagnosis not present

## 2015-12-05 DIAGNOSIS — R262 Difficulty in walking, not elsewhere classified: Secondary | ICD-10-CM | POA: Diagnosis not present

## 2015-12-05 DIAGNOSIS — M62552 Muscle wasting and atrophy, not elsewhere classified, left thigh: Secondary | ICD-10-CM | POA: Diagnosis not present

## 2015-12-05 DIAGNOSIS — M6281 Muscle weakness (generalized): Secondary | ICD-10-CM | POA: Diagnosis not present

## 2015-12-07 DIAGNOSIS — M6281 Muscle weakness (generalized): Secondary | ICD-10-CM | POA: Diagnosis not present

## 2015-12-07 DIAGNOSIS — M62552 Muscle wasting and atrophy, not elsewhere classified, left thigh: Secondary | ICD-10-CM | POA: Diagnosis not present

## 2015-12-07 DIAGNOSIS — R262 Difficulty in walking, not elsewhere classified: Secondary | ICD-10-CM | POA: Diagnosis not present

## 2015-12-09 DIAGNOSIS — M62552 Muscle wasting and atrophy, not elsewhere classified, left thigh: Secondary | ICD-10-CM | POA: Diagnosis not present

## 2015-12-09 DIAGNOSIS — R262 Difficulty in walking, not elsewhere classified: Secondary | ICD-10-CM | POA: Diagnosis not present

## 2015-12-09 DIAGNOSIS — D510 Vitamin B12 deficiency anemia due to intrinsic factor deficiency: Secondary | ICD-10-CM | POA: Diagnosis not present

## 2015-12-09 DIAGNOSIS — M6281 Muscle weakness (generalized): Secondary | ICD-10-CM | POA: Diagnosis not present

## 2015-12-12 DIAGNOSIS — R262 Difficulty in walking, not elsewhere classified: Secondary | ICD-10-CM | POA: Diagnosis not present

## 2015-12-12 DIAGNOSIS — M62552 Muscle wasting and atrophy, not elsewhere classified, left thigh: Secondary | ICD-10-CM | POA: Diagnosis not present

## 2015-12-12 DIAGNOSIS — M6281 Muscle weakness (generalized): Secondary | ICD-10-CM | POA: Diagnosis not present

## 2015-12-14 DIAGNOSIS — M62552 Muscle wasting and atrophy, not elsewhere classified, left thigh: Secondary | ICD-10-CM | POA: Diagnosis not present

## 2015-12-14 DIAGNOSIS — M6281 Muscle weakness (generalized): Secondary | ICD-10-CM | POA: Diagnosis not present

## 2015-12-14 DIAGNOSIS — R262 Difficulty in walking, not elsewhere classified: Secondary | ICD-10-CM | POA: Diagnosis not present

## 2015-12-16 DIAGNOSIS — M6281 Muscle weakness (generalized): Secondary | ICD-10-CM | POA: Diagnosis not present

## 2015-12-16 DIAGNOSIS — M62552 Muscle wasting and atrophy, not elsewhere classified, left thigh: Secondary | ICD-10-CM | POA: Diagnosis not present

## 2015-12-16 DIAGNOSIS — R262 Difficulty in walking, not elsewhere classified: Secondary | ICD-10-CM | POA: Diagnosis not present

## 2015-12-19 DIAGNOSIS — M62552 Muscle wasting and atrophy, not elsewhere classified, left thigh: Secondary | ICD-10-CM | POA: Diagnosis not present

## 2015-12-19 DIAGNOSIS — M6281 Muscle weakness (generalized): Secondary | ICD-10-CM | POA: Diagnosis not present

## 2015-12-19 DIAGNOSIS — R262 Difficulty in walking, not elsewhere classified: Secondary | ICD-10-CM | POA: Diagnosis not present

## 2015-12-22 DIAGNOSIS — M62552 Muscle wasting and atrophy, not elsewhere classified, left thigh: Secondary | ICD-10-CM | POA: Diagnosis not present

## 2015-12-22 DIAGNOSIS — R262 Difficulty in walking, not elsewhere classified: Secondary | ICD-10-CM | POA: Diagnosis not present

## 2015-12-22 DIAGNOSIS — M6281 Muscle weakness (generalized): Secondary | ICD-10-CM | POA: Diagnosis not present

## 2015-12-26 DIAGNOSIS — M62552 Muscle wasting and atrophy, not elsewhere classified, left thigh: Secondary | ICD-10-CM | POA: Diagnosis not present

## 2015-12-26 DIAGNOSIS — M6281 Muscle weakness (generalized): Secondary | ICD-10-CM | POA: Diagnosis not present

## 2015-12-26 DIAGNOSIS — R262 Difficulty in walking, not elsewhere classified: Secondary | ICD-10-CM | POA: Diagnosis not present

## 2015-12-27 DIAGNOSIS — B351 Tinea unguium: Secondary | ICD-10-CM | POA: Diagnosis not present

## 2015-12-27 DIAGNOSIS — E785 Hyperlipidemia, unspecified: Secondary | ICD-10-CM | POA: Diagnosis not present

## 2015-12-27 DIAGNOSIS — Z1389 Encounter for screening for other disorder: Secondary | ICD-10-CM | POA: Diagnosis not present

## 2015-12-27 DIAGNOSIS — E1165 Type 2 diabetes mellitus with hyperglycemia: Secondary | ICD-10-CM | POA: Diagnosis not present

## 2015-12-29 DIAGNOSIS — M6281 Muscle weakness (generalized): Secondary | ICD-10-CM | POA: Diagnosis not present

## 2015-12-29 DIAGNOSIS — M62552 Muscle wasting and atrophy, not elsewhere classified, left thigh: Secondary | ICD-10-CM | POA: Diagnosis not present

## 2015-12-29 DIAGNOSIS — R262 Difficulty in walking, not elsewhere classified: Secondary | ICD-10-CM | POA: Diagnosis not present

## 2016-01-03 DIAGNOSIS — M62552 Muscle wasting and atrophy, not elsewhere classified, left thigh: Secondary | ICD-10-CM | POA: Diagnosis not present

## 2016-01-03 DIAGNOSIS — R262 Difficulty in walking, not elsewhere classified: Secondary | ICD-10-CM | POA: Diagnosis not present

## 2016-01-03 DIAGNOSIS — M6281 Muscle weakness (generalized): Secondary | ICD-10-CM | POA: Diagnosis not present

## 2016-01-05 DIAGNOSIS — M6281 Muscle weakness (generalized): Secondary | ICD-10-CM | POA: Diagnosis not present

## 2016-01-05 DIAGNOSIS — M62552 Muscle wasting and atrophy, not elsewhere classified, left thigh: Secondary | ICD-10-CM | POA: Diagnosis not present

## 2016-01-05 DIAGNOSIS — R262 Difficulty in walking, not elsewhere classified: Secondary | ICD-10-CM | POA: Diagnosis not present

## 2016-01-10 DIAGNOSIS — M62552 Muscle wasting and atrophy, not elsewhere classified, left thigh: Secondary | ICD-10-CM | POA: Diagnosis not present

## 2016-01-10 DIAGNOSIS — M6281 Muscle weakness (generalized): Secondary | ICD-10-CM | POA: Diagnosis not present

## 2016-01-10 DIAGNOSIS — R262 Difficulty in walking, not elsewhere classified: Secondary | ICD-10-CM | POA: Diagnosis not present

## 2016-01-13 DIAGNOSIS — R262 Difficulty in walking, not elsewhere classified: Secondary | ICD-10-CM | POA: Diagnosis not present

## 2016-01-13 DIAGNOSIS — M62552 Muscle wasting and atrophy, not elsewhere classified, left thigh: Secondary | ICD-10-CM | POA: Diagnosis not present

## 2016-01-13 DIAGNOSIS — M6281 Muscle weakness (generalized): Secondary | ICD-10-CM | POA: Diagnosis not present

## 2016-01-18 DIAGNOSIS — M62552 Muscle wasting and atrophy, not elsewhere classified, left thigh: Secondary | ICD-10-CM | POA: Diagnosis not present

## 2016-01-18 DIAGNOSIS — M6281 Muscle weakness (generalized): Secondary | ICD-10-CM | POA: Diagnosis not present

## 2016-01-18 DIAGNOSIS — R262 Difficulty in walking, not elsewhere classified: Secondary | ICD-10-CM | POA: Diagnosis not present

## 2016-01-20 DIAGNOSIS — R262 Difficulty in walking, not elsewhere classified: Secondary | ICD-10-CM | POA: Diagnosis not present

## 2016-01-20 DIAGNOSIS — M62552 Muscle wasting and atrophy, not elsewhere classified, left thigh: Secondary | ICD-10-CM | POA: Diagnosis not present

## 2016-01-20 DIAGNOSIS — M6281 Muscle weakness (generalized): Secondary | ICD-10-CM | POA: Diagnosis not present

## 2016-01-25 DIAGNOSIS — M6281 Muscle weakness (generalized): Secondary | ICD-10-CM | POA: Diagnosis not present

## 2016-01-25 DIAGNOSIS — R262 Difficulty in walking, not elsewhere classified: Secondary | ICD-10-CM | POA: Diagnosis not present

## 2016-01-25 DIAGNOSIS — M62552 Muscle wasting and atrophy, not elsewhere classified, left thigh: Secondary | ICD-10-CM | POA: Diagnosis not present

## 2016-01-27 DIAGNOSIS — M6281 Muscle weakness (generalized): Secondary | ICD-10-CM | POA: Diagnosis not present

## 2016-01-27 DIAGNOSIS — M62552 Muscle wasting and atrophy, not elsewhere classified, left thigh: Secondary | ICD-10-CM | POA: Diagnosis not present

## 2016-01-27 DIAGNOSIS — R262 Difficulty in walking, not elsewhere classified: Secondary | ICD-10-CM | POA: Diagnosis not present

## 2016-01-30 DIAGNOSIS — Z79899 Other long term (current) drug therapy: Secondary | ICD-10-CM | POA: Diagnosis not present

## 2016-02-01 DIAGNOSIS — R262 Difficulty in walking, not elsewhere classified: Secondary | ICD-10-CM | POA: Diagnosis not present

## 2016-02-01 DIAGNOSIS — M62552 Muscle wasting and atrophy, not elsewhere classified, left thigh: Secondary | ICD-10-CM | POA: Diagnosis not present

## 2016-02-01 DIAGNOSIS — M6281 Muscle weakness (generalized): Secondary | ICD-10-CM | POA: Diagnosis not present

## 2016-02-08 DIAGNOSIS — R262 Difficulty in walking, not elsewhere classified: Secondary | ICD-10-CM | POA: Diagnosis not present

## 2016-02-08 DIAGNOSIS — M62552 Muscle wasting and atrophy, not elsewhere classified, left thigh: Secondary | ICD-10-CM | POA: Diagnosis not present

## 2016-02-08 DIAGNOSIS — M6281 Muscle weakness (generalized): Secondary | ICD-10-CM | POA: Diagnosis not present

## 2016-02-15 DIAGNOSIS — M62552 Muscle wasting and atrophy, not elsewhere classified, left thigh: Secondary | ICD-10-CM | POA: Diagnosis not present

## 2016-02-15 DIAGNOSIS — M6281 Muscle weakness (generalized): Secondary | ICD-10-CM | POA: Diagnosis not present

## 2016-02-15 DIAGNOSIS — R262 Difficulty in walking, not elsewhere classified: Secondary | ICD-10-CM | POA: Diagnosis not present

## 2016-02-23 DIAGNOSIS — R262 Difficulty in walking, not elsewhere classified: Secondary | ICD-10-CM | POA: Diagnosis not present

## 2016-02-23 DIAGNOSIS — M6281 Muscle weakness (generalized): Secondary | ICD-10-CM | POA: Diagnosis not present

## 2016-02-23 DIAGNOSIS — M62552 Muscle wasting and atrophy, not elsewhere classified, left thigh: Secondary | ICD-10-CM | POA: Diagnosis not present

## 2016-02-29 DIAGNOSIS — M6281 Muscle weakness (generalized): Secondary | ICD-10-CM | POA: Diagnosis not present

## 2016-02-29 DIAGNOSIS — R262 Difficulty in walking, not elsewhere classified: Secondary | ICD-10-CM | POA: Diagnosis not present

## 2016-02-29 DIAGNOSIS — M62552 Muscle wasting and atrophy, not elsewhere classified, left thigh: Secondary | ICD-10-CM | POA: Diagnosis not present

## 2016-05-02 DIAGNOSIS — R531 Weakness: Secondary | ICD-10-CM | POA: Diagnosis not present

## 2016-05-02 DIAGNOSIS — G959 Disease of spinal cord, unspecified: Secondary | ICD-10-CM | POA: Diagnosis not present

## 2016-05-02 DIAGNOSIS — Z981 Arthrodesis status: Secondary | ICD-10-CM | POA: Diagnosis not present

## 2016-05-02 DIAGNOSIS — R262 Difficulty in walking, not elsewhere classified: Secondary | ICD-10-CM | POA: Diagnosis not present

## 2016-05-02 DIAGNOSIS — M4802 Spinal stenosis, cervical region: Secondary | ICD-10-CM | POA: Diagnosis not present

## 2016-05-21 DIAGNOSIS — N401 Enlarged prostate with lower urinary tract symptoms: Secondary | ICD-10-CM | POA: Diagnosis not present

## 2016-05-21 DIAGNOSIS — R351 Nocturia: Secondary | ICD-10-CM | POA: Diagnosis not present

## 2016-05-21 DIAGNOSIS — N5203 Combined arterial insufficiency and corporo-venous occlusive erectile dysfunction: Secondary | ICD-10-CM | POA: Diagnosis not present

## 2016-05-21 DIAGNOSIS — R338 Other retention of urine: Secondary | ICD-10-CM | POA: Diagnosis not present

## 2016-05-21 DIAGNOSIS — N318 Other neuromuscular dysfunction of bladder: Secondary | ICD-10-CM | POA: Diagnosis not present

## 2016-05-21 DIAGNOSIS — N3020 Other chronic cystitis without hematuria: Secondary | ICD-10-CM | POA: Diagnosis not present

## 2016-06-20 DIAGNOSIS — E785 Hyperlipidemia, unspecified: Secondary | ICD-10-CM | POA: Diagnosis not present

## 2016-06-20 DIAGNOSIS — Z0000 Encounter for general adult medical examination without abnormal findings: Secondary | ICD-10-CM | POA: Diagnosis not present

## 2016-06-20 DIAGNOSIS — N400 Enlarged prostate without lower urinary tract symptoms: Secondary | ICD-10-CM | POA: Diagnosis not present

## 2016-06-20 DIAGNOSIS — Z23 Encounter for immunization: Secondary | ICD-10-CM | POA: Diagnosis not present

## 2016-06-20 DIAGNOSIS — Z9181 History of falling: Secondary | ICD-10-CM | POA: Diagnosis not present

## 2016-06-20 DIAGNOSIS — E1165 Type 2 diabetes mellitus with hyperglycemia: Secondary | ICD-10-CM | POA: Diagnosis not present

## 2016-06-20 DIAGNOSIS — D519 Vitamin B12 deficiency anemia, unspecified: Secondary | ICD-10-CM | POA: Diagnosis not present

## 2016-06-20 DIAGNOSIS — R5383 Other fatigue: Secondary | ICD-10-CM | POA: Diagnosis not present

## 2016-06-20 DIAGNOSIS — Z79899 Other long term (current) drug therapy: Secondary | ICD-10-CM | POA: Diagnosis not present

## 2016-06-27 DIAGNOSIS — E119 Type 2 diabetes mellitus without complications: Secondary | ICD-10-CM | POA: Diagnosis not present

## 2016-06-27 DIAGNOSIS — L603 Nail dystrophy: Secondary | ICD-10-CM | POA: Diagnosis not present

## 2016-06-27 DIAGNOSIS — E1142 Type 2 diabetes mellitus with diabetic polyneuropathy: Secondary | ICD-10-CM | POA: Diagnosis not present

## 2016-06-27 DIAGNOSIS — M2042 Other hammer toe(s) (acquired), left foot: Secondary | ICD-10-CM | POA: Diagnosis not present

## 2016-06-27 DIAGNOSIS — M2041 Other hammer toe(s) (acquired), right foot: Secondary | ICD-10-CM | POA: Diagnosis not present

## 2017-01-25 DIAGNOSIS — H25811 Combined forms of age-related cataract, right eye: Secondary | ICD-10-CM | POA: Diagnosis not present

## 2017-02-12 DIAGNOSIS — E119 Type 2 diabetes mellitus without complications: Secondary | ICD-10-CM | POA: Diagnosis not present

## 2017-02-12 DIAGNOSIS — H25811 Combined forms of age-related cataract, right eye: Secondary | ICD-10-CM | POA: Diagnosis not present

## 2017-02-12 DIAGNOSIS — Z7984 Long term (current) use of oral hypoglycemic drugs: Secondary | ICD-10-CM | POA: Diagnosis not present

## 2017-02-12 DIAGNOSIS — Z79899 Other long term (current) drug therapy: Secondary | ICD-10-CM | POA: Diagnosis not present

## 2017-02-12 DIAGNOSIS — E785 Hyperlipidemia, unspecified: Secondary | ICD-10-CM | POA: Diagnosis not present

## 2017-02-12 DIAGNOSIS — I10 Essential (primary) hypertension: Secondary | ICD-10-CM | POA: Diagnosis not present

## 2017-02-28 DIAGNOSIS — Z1389 Encounter for screening for other disorder: Secondary | ICD-10-CM | POA: Diagnosis not present

## 2017-02-28 DIAGNOSIS — Z79899 Other long term (current) drug therapy: Secondary | ICD-10-CM | POA: Diagnosis not present

## 2017-02-28 DIAGNOSIS — D519 Vitamin B12 deficiency anemia, unspecified: Secondary | ICD-10-CM | POA: Diagnosis not present

## 2017-02-28 DIAGNOSIS — Z0000 Encounter for general adult medical examination without abnormal findings: Secondary | ICD-10-CM | POA: Diagnosis not present

## 2017-02-28 DIAGNOSIS — Z6823 Body mass index (BMI) 23.0-23.9, adult: Secondary | ICD-10-CM | POA: Diagnosis not present

## 2017-02-28 DIAGNOSIS — E785 Hyperlipidemia, unspecified: Secondary | ICD-10-CM | POA: Diagnosis not present

## 2017-02-28 DIAGNOSIS — E1165 Type 2 diabetes mellitus with hyperglycemia: Secondary | ICD-10-CM | POA: Diagnosis not present

## 2017-03-06 DIAGNOSIS — L570 Actinic keratosis: Secondary | ICD-10-CM | POA: Diagnosis not present

## 2017-03-19 DIAGNOSIS — G629 Polyneuropathy, unspecified: Secondary | ICD-10-CM | POA: Diagnosis not present

## 2017-03-19 DIAGNOSIS — H9190 Unspecified hearing loss, unspecified ear: Secondary | ICD-10-CM | POA: Diagnosis not present

## 2017-03-19 DIAGNOSIS — E119 Type 2 diabetes mellitus without complications: Secondary | ICD-10-CM | POA: Diagnosis not present

## 2017-03-19 DIAGNOSIS — H259 Unspecified age-related cataract: Secondary | ICD-10-CM | POA: Diagnosis not present

## 2017-03-19 DIAGNOSIS — E1136 Type 2 diabetes mellitus with diabetic cataract: Secondary | ICD-10-CM | POA: Diagnosis not present

## 2017-03-19 DIAGNOSIS — Z7984 Long term (current) use of oral hypoglycemic drugs: Secondary | ICD-10-CM | POA: Diagnosis not present

## 2017-03-19 DIAGNOSIS — I10 Essential (primary) hypertension: Secondary | ICD-10-CM | POA: Diagnosis not present

## 2017-03-19 DIAGNOSIS — N401 Enlarged prostate with lower urinary tract symptoms: Secondary | ICD-10-CM | POA: Diagnosis not present

## 2017-03-19 DIAGNOSIS — Z79899 Other long term (current) drug therapy: Secondary | ICD-10-CM | POA: Diagnosis not present

## 2017-03-19 DIAGNOSIS — Z7982 Long term (current) use of aspirin: Secondary | ICD-10-CM | POA: Diagnosis not present

## 2017-03-19 DIAGNOSIS — H25812 Combined forms of age-related cataract, left eye: Secondary | ICD-10-CM | POA: Diagnosis not present

## 2017-04-22 DIAGNOSIS — Z4789 Encounter for other orthopedic aftercare: Secondary | ICD-10-CM | POA: Diagnosis not present

## 2017-04-22 DIAGNOSIS — M4316 Spondylolisthesis, lumbar region: Secondary | ICD-10-CM | POA: Diagnosis not present

## 2017-04-22 DIAGNOSIS — T84498A Other mechanical complication of other internal orthopedic devices, implants and grafts, initial encounter: Secondary | ICD-10-CM | POA: Diagnosis not present

## 2017-04-22 DIAGNOSIS — R200 Anesthesia of skin: Secondary | ICD-10-CM | POA: Diagnosis not present

## 2017-04-22 DIAGNOSIS — Z981 Arthrodesis status: Secondary | ICD-10-CM | POA: Diagnosis not present

## 2017-04-22 DIAGNOSIS — R531 Weakness: Secondary | ICD-10-CM | POA: Diagnosis not present

## 2017-04-22 DIAGNOSIS — M8588 Other specified disorders of bone density and structure, other site: Secondary | ICD-10-CM | POA: Diagnosis not present

## 2017-04-25 DIAGNOSIS — M62551 Muscle wasting and atrophy, not elsewhere classified, right thigh: Secondary | ICD-10-CM | POA: Diagnosis not present

## 2017-04-25 DIAGNOSIS — M62521 Muscle wasting and atrophy, not elsewhere classified, right upper arm: Secondary | ICD-10-CM | POA: Diagnosis not present

## 2017-04-25 DIAGNOSIS — M62552 Muscle wasting and atrophy, not elsewhere classified, left thigh: Secondary | ICD-10-CM | POA: Diagnosis not present

## 2017-04-25 DIAGNOSIS — R2689 Other abnormalities of gait and mobility: Secondary | ICD-10-CM | POA: Diagnosis not present

## 2017-04-29 DIAGNOSIS — M62551 Muscle wasting and atrophy, not elsewhere classified, right thigh: Secondary | ICD-10-CM | POA: Diagnosis not present

## 2017-04-29 DIAGNOSIS — M62552 Muscle wasting and atrophy, not elsewhere classified, left thigh: Secondary | ICD-10-CM | POA: Diagnosis not present

## 2017-04-29 DIAGNOSIS — R2689 Other abnormalities of gait and mobility: Secondary | ICD-10-CM | POA: Diagnosis not present

## 2017-04-29 DIAGNOSIS — M62521 Muscle wasting and atrophy, not elsewhere classified, right upper arm: Secondary | ICD-10-CM | POA: Diagnosis not present

## 2017-05-01 DIAGNOSIS — M62551 Muscle wasting and atrophy, not elsewhere classified, right thigh: Secondary | ICD-10-CM | POA: Diagnosis not present

## 2017-05-01 DIAGNOSIS — M62552 Muscle wasting and atrophy, not elsewhere classified, left thigh: Secondary | ICD-10-CM | POA: Diagnosis not present

## 2017-05-01 DIAGNOSIS — R2689 Other abnormalities of gait and mobility: Secondary | ICD-10-CM | POA: Diagnosis not present

## 2017-05-01 DIAGNOSIS — M62521 Muscle wasting and atrophy, not elsewhere classified, right upper arm: Secondary | ICD-10-CM | POA: Diagnosis not present

## 2017-05-03 DIAGNOSIS — M62551 Muscle wasting and atrophy, not elsewhere classified, right thigh: Secondary | ICD-10-CM | POA: Diagnosis not present

## 2017-05-03 DIAGNOSIS — M62552 Muscle wasting and atrophy, not elsewhere classified, left thigh: Secondary | ICD-10-CM | POA: Diagnosis not present

## 2017-05-03 DIAGNOSIS — R2689 Other abnormalities of gait and mobility: Secondary | ICD-10-CM | POA: Diagnosis not present

## 2017-05-03 DIAGNOSIS — M62521 Muscle wasting and atrophy, not elsewhere classified, right upper arm: Secondary | ICD-10-CM | POA: Diagnosis not present

## 2017-05-07 DIAGNOSIS — M62552 Muscle wasting and atrophy, not elsewhere classified, left thigh: Secondary | ICD-10-CM | POA: Diagnosis not present

## 2017-05-07 DIAGNOSIS — M62551 Muscle wasting and atrophy, not elsewhere classified, right thigh: Secondary | ICD-10-CM | POA: Diagnosis not present

## 2017-05-07 DIAGNOSIS — M62521 Muscle wasting and atrophy, not elsewhere classified, right upper arm: Secondary | ICD-10-CM | POA: Diagnosis not present

## 2017-05-07 DIAGNOSIS — R2689 Other abnormalities of gait and mobility: Secondary | ICD-10-CM | POA: Diagnosis not present

## 2017-05-08 DIAGNOSIS — R2689 Other abnormalities of gait and mobility: Secondary | ICD-10-CM | POA: Diagnosis not present

## 2017-05-08 DIAGNOSIS — M62551 Muscle wasting and atrophy, not elsewhere classified, right thigh: Secondary | ICD-10-CM | POA: Diagnosis not present

## 2017-05-08 DIAGNOSIS — M62521 Muscle wasting and atrophy, not elsewhere classified, right upper arm: Secondary | ICD-10-CM | POA: Diagnosis not present

## 2017-05-08 DIAGNOSIS — M62552 Muscle wasting and atrophy, not elsewhere classified, left thigh: Secondary | ICD-10-CM | POA: Diagnosis not present

## 2017-05-10 DIAGNOSIS — M62551 Muscle wasting and atrophy, not elsewhere classified, right thigh: Secondary | ICD-10-CM | POA: Diagnosis not present

## 2017-05-10 DIAGNOSIS — R2689 Other abnormalities of gait and mobility: Secondary | ICD-10-CM | POA: Diagnosis not present

## 2017-05-10 DIAGNOSIS — M62521 Muscle wasting and atrophy, not elsewhere classified, right upper arm: Secondary | ICD-10-CM | POA: Diagnosis not present

## 2017-05-10 DIAGNOSIS — M62552 Muscle wasting and atrophy, not elsewhere classified, left thigh: Secondary | ICD-10-CM | POA: Diagnosis not present

## 2017-05-13 DIAGNOSIS — M62552 Muscle wasting and atrophy, not elsewhere classified, left thigh: Secondary | ICD-10-CM | POA: Diagnosis not present

## 2017-05-13 DIAGNOSIS — M62521 Muscle wasting and atrophy, not elsewhere classified, right upper arm: Secondary | ICD-10-CM | POA: Diagnosis not present

## 2017-05-13 DIAGNOSIS — R2689 Other abnormalities of gait and mobility: Secondary | ICD-10-CM | POA: Diagnosis not present

## 2017-05-13 DIAGNOSIS — M62551 Muscle wasting and atrophy, not elsewhere classified, right thigh: Secondary | ICD-10-CM | POA: Diagnosis not present

## 2017-05-15 DIAGNOSIS — R2689 Other abnormalities of gait and mobility: Secondary | ICD-10-CM | POA: Diagnosis not present

## 2017-05-15 DIAGNOSIS — M62551 Muscle wasting and atrophy, not elsewhere classified, right thigh: Secondary | ICD-10-CM | POA: Diagnosis not present

## 2017-05-15 DIAGNOSIS — M62552 Muscle wasting and atrophy, not elsewhere classified, left thigh: Secondary | ICD-10-CM | POA: Diagnosis not present

## 2017-05-15 DIAGNOSIS — M62521 Muscle wasting and atrophy, not elsewhere classified, right upper arm: Secondary | ICD-10-CM | POA: Diagnosis not present

## 2017-05-17 DIAGNOSIS — M62521 Muscle wasting and atrophy, not elsewhere classified, right upper arm: Secondary | ICD-10-CM | POA: Diagnosis not present

## 2017-05-17 DIAGNOSIS — R2689 Other abnormalities of gait and mobility: Secondary | ICD-10-CM | POA: Diagnosis not present

## 2017-05-17 DIAGNOSIS — M62551 Muscle wasting and atrophy, not elsewhere classified, right thigh: Secondary | ICD-10-CM | POA: Diagnosis not present

## 2017-05-17 DIAGNOSIS — M62552 Muscle wasting and atrophy, not elsewhere classified, left thigh: Secondary | ICD-10-CM | POA: Diagnosis not present

## 2017-05-20 DIAGNOSIS — M62551 Muscle wasting and atrophy, not elsewhere classified, right thigh: Secondary | ICD-10-CM | POA: Diagnosis not present

## 2017-05-20 DIAGNOSIS — M62521 Muscle wasting and atrophy, not elsewhere classified, right upper arm: Secondary | ICD-10-CM | POA: Diagnosis not present

## 2017-05-20 DIAGNOSIS — R2689 Other abnormalities of gait and mobility: Secondary | ICD-10-CM | POA: Diagnosis not present

## 2017-05-20 DIAGNOSIS — M62552 Muscle wasting and atrophy, not elsewhere classified, left thigh: Secondary | ICD-10-CM | POA: Diagnosis not present

## 2017-05-22 DIAGNOSIS — R2689 Other abnormalities of gait and mobility: Secondary | ICD-10-CM | POA: Diagnosis not present

## 2017-05-22 DIAGNOSIS — M62551 Muscle wasting and atrophy, not elsewhere classified, right thigh: Secondary | ICD-10-CM | POA: Diagnosis not present

## 2017-05-22 DIAGNOSIS — M62521 Muscle wasting and atrophy, not elsewhere classified, right upper arm: Secondary | ICD-10-CM | POA: Diagnosis not present

## 2017-05-22 DIAGNOSIS — M62552 Muscle wasting and atrophy, not elsewhere classified, left thigh: Secondary | ICD-10-CM | POA: Diagnosis not present

## 2017-05-23 DIAGNOSIS — R2689 Other abnormalities of gait and mobility: Secondary | ICD-10-CM | POA: Diagnosis not present

## 2017-05-23 DIAGNOSIS — M62552 Muscle wasting and atrophy, not elsewhere classified, left thigh: Secondary | ICD-10-CM | POA: Diagnosis not present

## 2017-05-23 DIAGNOSIS — M62551 Muscle wasting and atrophy, not elsewhere classified, right thigh: Secondary | ICD-10-CM | POA: Diagnosis not present

## 2017-05-23 DIAGNOSIS — M62521 Muscle wasting and atrophy, not elsewhere classified, right upper arm: Secondary | ICD-10-CM | POA: Diagnosis not present

## 2017-05-29 DIAGNOSIS — R2689 Other abnormalities of gait and mobility: Secondary | ICD-10-CM | POA: Diagnosis not present

## 2017-05-29 DIAGNOSIS — M62521 Muscle wasting and atrophy, not elsewhere classified, right upper arm: Secondary | ICD-10-CM | POA: Diagnosis not present

## 2017-05-29 DIAGNOSIS — M62552 Muscle wasting and atrophy, not elsewhere classified, left thigh: Secondary | ICD-10-CM | POA: Diagnosis not present

## 2017-05-29 DIAGNOSIS — M62551 Muscle wasting and atrophy, not elsewhere classified, right thigh: Secondary | ICD-10-CM | POA: Diagnosis not present

## 2017-05-30 DIAGNOSIS — M62551 Muscle wasting and atrophy, not elsewhere classified, right thigh: Secondary | ICD-10-CM | POA: Diagnosis not present

## 2017-05-30 DIAGNOSIS — M62521 Muscle wasting and atrophy, not elsewhere classified, right upper arm: Secondary | ICD-10-CM | POA: Diagnosis not present

## 2017-05-30 DIAGNOSIS — M62552 Muscle wasting and atrophy, not elsewhere classified, left thigh: Secondary | ICD-10-CM | POA: Diagnosis not present

## 2017-05-30 DIAGNOSIS — R2689 Other abnormalities of gait and mobility: Secondary | ICD-10-CM | POA: Diagnosis not present

## 2017-05-31 DIAGNOSIS — M62551 Muscle wasting and atrophy, not elsewhere classified, right thigh: Secondary | ICD-10-CM | POA: Diagnosis not present

## 2017-05-31 DIAGNOSIS — M62521 Muscle wasting and atrophy, not elsewhere classified, right upper arm: Secondary | ICD-10-CM | POA: Diagnosis not present

## 2017-05-31 DIAGNOSIS — M62552 Muscle wasting and atrophy, not elsewhere classified, left thigh: Secondary | ICD-10-CM | POA: Diagnosis not present

## 2017-05-31 DIAGNOSIS — R2689 Other abnormalities of gait and mobility: Secondary | ICD-10-CM | POA: Diagnosis not present

## 2017-06-03 DIAGNOSIS — R2689 Other abnormalities of gait and mobility: Secondary | ICD-10-CM | POA: Diagnosis not present

## 2017-06-03 DIAGNOSIS — M62521 Muscle wasting and atrophy, not elsewhere classified, right upper arm: Secondary | ICD-10-CM | POA: Diagnosis not present

## 2017-06-03 DIAGNOSIS — M62551 Muscle wasting and atrophy, not elsewhere classified, right thigh: Secondary | ICD-10-CM | POA: Diagnosis not present

## 2017-06-03 DIAGNOSIS — M62552 Muscle wasting and atrophy, not elsewhere classified, left thigh: Secondary | ICD-10-CM | POA: Diagnosis not present

## 2017-06-05 DIAGNOSIS — M62552 Muscle wasting and atrophy, not elsewhere classified, left thigh: Secondary | ICD-10-CM | POA: Diagnosis not present

## 2017-06-05 DIAGNOSIS — M62521 Muscle wasting and atrophy, not elsewhere classified, right upper arm: Secondary | ICD-10-CM | POA: Diagnosis not present

## 2017-06-05 DIAGNOSIS — M62551 Muscle wasting and atrophy, not elsewhere classified, right thigh: Secondary | ICD-10-CM | POA: Diagnosis not present

## 2017-06-05 DIAGNOSIS — R2689 Other abnormalities of gait and mobility: Secondary | ICD-10-CM | POA: Diagnosis not present

## 2017-06-07 DIAGNOSIS — M62521 Muscle wasting and atrophy, not elsewhere classified, right upper arm: Secondary | ICD-10-CM | POA: Diagnosis not present

## 2017-06-07 DIAGNOSIS — M62552 Muscle wasting and atrophy, not elsewhere classified, left thigh: Secondary | ICD-10-CM | POA: Diagnosis not present

## 2017-06-07 DIAGNOSIS — M62551 Muscle wasting and atrophy, not elsewhere classified, right thigh: Secondary | ICD-10-CM | POA: Diagnosis not present

## 2017-06-07 DIAGNOSIS — R2689 Other abnormalities of gait and mobility: Secondary | ICD-10-CM | POA: Diagnosis not present

## 2017-06-10 DIAGNOSIS — R2689 Other abnormalities of gait and mobility: Secondary | ICD-10-CM | POA: Diagnosis not present

## 2017-06-10 DIAGNOSIS — M62551 Muscle wasting and atrophy, not elsewhere classified, right thigh: Secondary | ICD-10-CM | POA: Diagnosis not present

## 2017-06-10 DIAGNOSIS — M62552 Muscle wasting and atrophy, not elsewhere classified, left thigh: Secondary | ICD-10-CM | POA: Diagnosis not present

## 2017-06-10 DIAGNOSIS — M62521 Muscle wasting and atrophy, not elsewhere classified, right upper arm: Secondary | ICD-10-CM | POA: Diagnosis not present

## 2017-06-12 DIAGNOSIS — M62551 Muscle wasting and atrophy, not elsewhere classified, right thigh: Secondary | ICD-10-CM | POA: Diagnosis not present

## 2017-06-12 DIAGNOSIS — M62552 Muscle wasting and atrophy, not elsewhere classified, left thigh: Secondary | ICD-10-CM | POA: Diagnosis not present

## 2017-06-12 DIAGNOSIS — R2689 Other abnormalities of gait and mobility: Secondary | ICD-10-CM | POA: Diagnosis not present

## 2017-06-12 DIAGNOSIS — M62521 Muscle wasting and atrophy, not elsewhere classified, right upper arm: Secondary | ICD-10-CM | POA: Diagnosis not present

## 2017-06-14 DIAGNOSIS — M62521 Muscle wasting and atrophy, not elsewhere classified, right upper arm: Secondary | ICD-10-CM | POA: Diagnosis not present

## 2017-06-14 DIAGNOSIS — M62551 Muscle wasting and atrophy, not elsewhere classified, right thigh: Secondary | ICD-10-CM | POA: Diagnosis not present

## 2017-06-14 DIAGNOSIS — R2689 Other abnormalities of gait and mobility: Secondary | ICD-10-CM | POA: Diagnosis not present

## 2017-06-14 DIAGNOSIS — M62552 Muscle wasting and atrophy, not elsewhere classified, left thigh: Secondary | ICD-10-CM | POA: Diagnosis not present

## 2017-06-17 DIAGNOSIS — M62551 Muscle wasting and atrophy, not elsewhere classified, right thigh: Secondary | ICD-10-CM | POA: Diagnosis not present

## 2017-06-17 DIAGNOSIS — R2689 Other abnormalities of gait and mobility: Secondary | ICD-10-CM | POA: Diagnosis not present

## 2017-06-17 DIAGNOSIS — M62552 Muscle wasting and atrophy, not elsewhere classified, left thigh: Secondary | ICD-10-CM | POA: Diagnosis not present

## 2017-06-17 DIAGNOSIS — M62521 Muscle wasting and atrophy, not elsewhere classified, right upper arm: Secondary | ICD-10-CM | POA: Diagnosis not present

## 2017-06-19 DIAGNOSIS — M62551 Muscle wasting and atrophy, not elsewhere classified, right thigh: Secondary | ICD-10-CM | POA: Diagnosis not present

## 2017-06-19 DIAGNOSIS — M62521 Muscle wasting and atrophy, not elsewhere classified, right upper arm: Secondary | ICD-10-CM | POA: Diagnosis not present

## 2017-06-19 DIAGNOSIS — R2689 Other abnormalities of gait and mobility: Secondary | ICD-10-CM | POA: Diagnosis not present

## 2017-06-19 DIAGNOSIS — M62552 Muscle wasting and atrophy, not elsewhere classified, left thigh: Secondary | ICD-10-CM | POA: Diagnosis not present

## 2017-06-21 DIAGNOSIS — M62551 Muscle wasting and atrophy, not elsewhere classified, right thigh: Secondary | ICD-10-CM | POA: Diagnosis not present

## 2017-06-21 DIAGNOSIS — M62552 Muscle wasting and atrophy, not elsewhere classified, left thigh: Secondary | ICD-10-CM | POA: Diagnosis not present

## 2017-06-21 DIAGNOSIS — M62521 Muscle wasting and atrophy, not elsewhere classified, right upper arm: Secondary | ICD-10-CM | POA: Diagnosis not present

## 2017-06-21 DIAGNOSIS — R2689 Other abnormalities of gait and mobility: Secondary | ICD-10-CM | POA: Diagnosis not present

## 2017-06-24 DIAGNOSIS — M62551 Muscle wasting and atrophy, not elsewhere classified, right thigh: Secondary | ICD-10-CM | POA: Diagnosis not present

## 2017-06-24 DIAGNOSIS — M62521 Muscle wasting and atrophy, not elsewhere classified, right upper arm: Secondary | ICD-10-CM | POA: Diagnosis not present

## 2017-06-24 DIAGNOSIS — M62552 Muscle wasting and atrophy, not elsewhere classified, left thigh: Secondary | ICD-10-CM | POA: Diagnosis not present

## 2017-06-24 DIAGNOSIS — R2689 Other abnormalities of gait and mobility: Secondary | ICD-10-CM | POA: Diagnosis not present

## 2017-06-26 DIAGNOSIS — M62551 Muscle wasting and atrophy, not elsewhere classified, right thigh: Secondary | ICD-10-CM | POA: Diagnosis not present

## 2017-06-26 DIAGNOSIS — M62521 Muscle wasting and atrophy, not elsewhere classified, right upper arm: Secondary | ICD-10-CM | POA: Diagnosis not present

## 2017-06-26 DIAGNOSIS — M62552 Muscle wasting and atrophy, not elsewhere classified, left thigh: Secondary | ICD-10-CM | POA: Diagnosis not present

## 2017-06-26 DIAGNOSIS — R2689 Other abnormalities of gait and mobility: Secondary | ICD-10-CM | POA: Diagnosis not present

## 2017-06-28 DIAGNOSIS — M62552 Muscle wasting and atrophy, not elsewhere classified, left thigh: Secondary | ICD-10-CM | POA: Diagnosis not present

## 2017-06-28 DIAGNOSIS — R2689 Other abnormalities of gait and mobility: Secondary | ICD-10-CM | POA: Diagnosis not present

## 2017-06-28 DIAGNOSIS — M62551 Muscle wasting and atrophy, not elsewhere classified, right thigh: Secondary | ICD-10-CM | POA: Diagnosis not present

## 2017-06-28 DIAGNOSIS — M62521 Muscle wasting and atrophy, not elsewhere classified, right upper arm: Secondary | ICD-10-CM | POA: Diagnosis not present

## 2017-07-01 DIAGNOSIS — M62552 Muscle wasting and atrophy, not elsewhere classified, left thigh: Secondary | ICD-10-CM | POA: Diagnosis not present

## 2017-07-01 DIAGNOSIS — M62551 Muscle wasting and atrophy, not elsewhere classified, right thigh: Secondary | ICD-10-CM | POA: Diagnosis not present

## 2017-07-01 DIAGNOSIS — R2689 Other abnormalities of gait and mobility: Secondary | ICD-10-CM | POA: Diagnosis not present

## 2017-07-01 DIAGNOSIS — M62521 Muscle wasting and atrophy, not elsewhere classified, right upper arm: Secondary | ICD-10-CM | POA: Diagnosis not present

## 2017-07-08 DIAGNOSIS — M62551 Muscle wasting and atrophy, not elsewhere classified, right thigh: Secondary | ICD-10-CM | POA: Diagnosis not present

## 2017-07-08 DIAGNOSIS — R2689 Other abnormalities of gait and mobility: Secondary | ICD-10-CM | POA: Diagnosis not present

## 2017-07-08 DIAGNOSIS — M62521 Muscle wasting and atrophy, not elsewhere classified, right upper arm: Secondary | ICD-10-CM | POA: Diagnosis not present

## 2017-07-08 DIAGNOSIS — M62552 Muscle wasting and atrophy, not elsewhere classified, left thigh: Secondary | ICD-10-CM | POA: Diagnosis not present

## 2017-07-10 DIAGNOSIS — R2689 Other abnormalities of gait and mobility: Secondary | ICD-10-CM | POA: Diagnosis not present

## 2017-07-10 DIAGNOSIS — M62552 Muscle wasting and atrophy, not elsewhere classified, left thigh: Secondary | ICD-10-CM | POA: Diagnosis not present

## 2017-07-10 DIAGNOSIS — M62551 Muscle wasting and atrophy, not elsewhere classified, right thigh: Secondary | ICD-10-CM | POA: Diagnosis not present

## 2017-07-10 DIAGNOSIS — M62521 Muscle wasting and atrophy, not elsewhere classified, right upper arm: Secondary | ICD-10-CM | POA: Diagnosis not present

## 2017-07-12 DIAGNOSIS — M62521 Muscle wasting and atrophy, not elsewhere classified, right upper arm: Secondary | ICD-10-CM | POA: Diagnosis not present

## 2017-07-12 DIAGNOSIS — M62551 Muscle wasting and atrophy, not elsewhere classified, right thigh: Secondary | ICD-10-CM | POA: Diagnosis not present

## 2017-07-12 DIAGNOSIS — R2689 Other abnormalities of gait and mobility: Secondary | ICD-10-CM | POA: Diagnosis not present

## 2017-07-12 DIAGNOSIS — M62552 Muscle wasting and atrophy, not elsewhere classified, left thigh: Secondary | ICD-10-CM | POA: Diagnosis not present

## 2017-07-15 DIAGNOSIS — M62521 Muscle wasting and atrophy, not elsewhere classified, right upper arm: Secondary | ICD-10-CM | POA: Diagnosis not present

## 2017-07-15 DIAGNOSIS — M62551 Muscle wasting and atrophy, not elsewhere classified, right thigh: Secondary | ICD-10-CM | POA: Diagnosis not present

## 2017-07-15 DIAGNOSIS — R2689 Other abnormalities of gait and mobility: Secondary | ICD-10-CM | POA: Diagnosis not present

## 2017-07-15 DIAGNOSIS — M62552 Muscle wasting and atrophy, not elsewhere classified, left thigh: Secondary | ICD-10-CM | POA: Diagnosis not present

## 2017-07-17 DIAGNOSIS — R2689 Other abnormalities of gait and mobility: Secondary | ICD-10-CM | POA: Diagnosis not present

## 2017-07-17 DIAGNOSIS — M62551 Muscle wasting and atrophy, not elsewhere classified, right thigh: Secondary | ICD-10-CM | POA: Diagnosis not present

## 2017-07-17 DIAGNOSIS — M62521 Muscle wasting and atrophy, not elsewhere classified, right upper arm: Secondary | ICD-10-CM | POA: Diagnosis not present

## 2017-07-17 DIAGNOSIS — M62552 Muscle wasting and atrophy, not elsewhere classified, left thigh: Secondary | ICD-10-CM | POA: Diagnosis not present

## 2017-07-19 DIAGNOSIS — M62521 Muscle wasting and atrophy, not elsewhere classified, right upper arm: Secondary | ICD-10-CM | POA: Diagnosis not present

## 2017-07-19 DIAGNOSIS — R2689 Other abnormalities of gait and mobility: Secondary | ICD-10-CM | POA: Diagnosis not present

## 2017-07-19 DIAGNOSIS — M62551 Muscle wasting and atrophy, not elsewhere classified, right thigh: Secondary | ICD-10-CM | POA: Diagnosis not present

## 2017-07-19 DIAGNOSIS — M62552 Muscle wasting and atrophy, not elsewhere classified, left thigh: Secondary | ICD-10-CM | POA: Diagnosis not present

## 2017-07-24 DIAGNOSIS — R2689 Other abnormalities of gait and mobility: Secondary | ICD-10-CM | POA: Diagnosis not present

## 2017-07-24 DIAGNOSIS — M62551 Muscle wasting and atrophy, not elsewhere classified, right thigh: Secondary | ICD-10-CM | POA: Diagnosis not present

## 2017-07-24 DIAGNOSIS — M62552 Muscle wasting and atrophy, not elsewhere classified, left thigh: Secondary | ICD-10-CM | POA: Diagnosis not present

## 2017-07-24 DIAGNOSIS — M62521 Muscle wasting and atrophy, not elsewhere classified, right upper arm: Secondary | ICD-10-CM | POA: Diagnosis not present

## 2017-07-26 DIAGNOSIS — M62551 Muscle wasting and atrophy, not elsewhere classified, right thigh: Secondary | ICD-10-CM | POA: Diagnosis not present

## 2017-07-26 DIAGNOSIS — M62552 Muscle wasting and atrophy, not elsewhere classified, left thigh: Secondary | ICD-10-CM | POA: Diagnosis not present

## 2017-07-26 DIAGNOSIS — M62521 Muscle wasting and atrophy, not elsewhere classified, right upper arm: Secondary | ICD-10-CM | POA: Diagnosis not present

## 2017-07-26 DIAGNOSIS — R2689 Other abnormalities of gait and mobility: Secondary | ICD-10-CM | POA: Diagnosis not present

## 2017-07-31 DIAGNOSIS — M62521 Muscle wasting and atrophy, not elsewhere classified, right upper arm: Secondary | ICD-10-CM | POA: Diagnosis not present

## 2017-07-31 DIAGNOSIS — M62552 Muscle wasting and atrophy, not elsewhere classified, left thigh: Secondary | ICD-10-CM | POA: Diagnosis not present

## 2017-07-31 DIAGNOSIS — M62551 Muscle wasting and atrophy, not elsewhere classified, right thigh: Secondary | ICD-10-CM | POA: Diagnosis not present

## 2017-07-31 DIAGNOSIS — R2689 Other abnormalities of gait and mobility: Secondary | ICD-10-CM | POA: Diagnosis not present

## 2017-08-02 DIAGNOSIS — M62552 Muscle wasting and atrophy, not elsewhere classified, left thigh: Secondary | ICD-10-CM | POA: Diagnosis not present

## 2017-08-02 DIAGNOSIS — R2689 Other abnormalities of gait and mobility: Secondary | ICD-10-CM | POA: Diagnosis not present

## 2017-08-02 DIAGNOSIS — M62521 Muscle wasting and atrophy, not elsewhere classified, right upper arm: Secondary | ICD-10-CM | POA: Diagnosis not present

## 2017-08-02 DIAGNOSIS — M62551 Muscle wasting and atrophy, not elsewhere classified, right thigh: Secondary | ICD-10-CM | POA: Diagnosis not present

## 2017-08-06 DIAGNOSIS — M62551 Muscle wasting and atrophy, not elsewhere classified, right thigh: Secondary | ICD-10-CM | POA: Diagnosis not present

## 2017-08-06 DIAGNOSIS — M62552 Muscle wasting and atrophy, not elsewhere classified, left thigh: Secondary | ICD-10-CM | POA: Diagnosis not present

## 2017-08-06 DIAGNOSIS — M62521 Muscle wasting and atrophy, not elsewhere classified, right upper arm: Secondary | ICD-10-CM | POA: Diagnosis not present

## 2017-08-06 DIAGNOSIS — R2689 Other abnormalities of gait and mobility: Secondary | ICD-10-CM | POA: Diagnosis not present

## 2017-08-13 DIAGNOSIS — M62521 Muscle wasting and atrophy, not elsewhere classified, right upper arm: Secondary | ICD-10-CM | POA: Diagnosis not present

## 2017-08-13 DIAGNOSIS — R2689 Other abnormalities of gait and mobility: Secondary | ICD-10-CM | POA: Diagnosis not present

## 2017-08-13 DIAGNOSIS — M62551 Muscle wasting and atrophy, not elsewhere classified, right thigh: Secondary | ICD-10-CM | POA: Diagnosis not present

## 2017-08-13 DIAGNOSIS — M62552 Muscle wasting and atrophy, not elsewhere classified, left thigh: Secondary | ICD-10-CM | POA: Diagnosis not present

## 2017-08-20 DIAGNOSIS — M62521 Muscle wasting and atrophy, not elsewhere classified, right upper arm: Secondary | ICD-10-CM | POA: Diagnosis not present

## 2017-08-20 DIAGNOSIS — M62551 Muscle wasting and atrophy, not elsewhere classified, right thigh: Secondary | ICD-10-CM | POA: Diagnosis not present

## 2017-08-20 DIAGNOSIS — R2689 Other abnormalities of gait and mobility: Secondary | ICD-10-CM | POA: Diagnosis not present

## 2017-08-20 DIAGNOSIS — M62552 Muscle wasting and atrophy, not elsewhere classified, left thigh: Secondary | ICD-10-CM | POA: Diagnosis not present

## 2017-09-06 DIAGNOSIS — S7001XA Contusion of right hip, initial encounter: Secondary | ICD-10-CM | POA: Diagnosis not present

## 2017-09-06 DIAGNOSIS — M1651 Unilateral post-traumatic osteoarthritis, right hip: Secondary | ICD-10-CM | POA: Diagnosis not present

## 2017-09-06 DIAGNOSIS — M62551 Muscle wasting and atrophy, not elsewhere classified, right thigh: Secondary | ICD-10-CM | POA: Diagnosis not present

## 2017-09-06 DIAGNOSIS — W19XXXA Unspecified fall, initial encounter: Secondary | ICD-10-CM | POA: Diagnosis not present

## 2017-09-06 DIAGNOSIS — S7011XA Contusion of right thigh, initial encounter: Secondary | ICD-10-CM | POA: Diagnosis not present

## 2017-09-06 DIAGNOSIS — M539 Dorsopathy, unspecified: Secondary | ICD-10-CM | POA: Diagnosis not present

## 2017-09-06 DIAGNOSIS — M62552 Muscle wasting and atrophy, not elsewhere classified, left thigh: Secondary | ICD-10-CM | POA: Diagnosis not present

## 2017-09-06 DIAGNOSIS — Z6822 Body mass index (BMI) 22.0-22.9, adult: Secondary | ICD-10-CM | POA: Diagnosis not present

## 2017-09-06 DIAGNOSIS — M62521 Muscle wasting and atrophy, not elsewhere classified, right upper arm: Secondary | ICD-10-CM | POA: Diagnosis not present

## 2017-09-06 DIAGNOSIS — R2689 Other abnormalities of gait and mobility: Secondary | ICD-10-CM | POA: Diagnosis not present

## 2017-09-13 DIAGNOSIS — M62552 Muscle wasting and atrophy, not elsewhere classified, left thigh: Secondary | ICD-10-CM | POA: Diagnosis not present

## 2017-09-13 DIAGNOSIS — M62521 Muscle wasting and atrophy, not elsewhere classified, right upper arm: Secondary | ICD-10-CM | POA: Diagnosis not present

## 2017-09-13 DIAGNOSIS — M62551 Muscle wasting and atrophy, not elsewhere classified, right thigh: Secondary | ICD-10-CM | POA: Diagnosis not present

## 2017-09-13 DIAGNOSIS — R2689 Other abnormalities of gait and mobility: Secondary | ICD-10-CM | POA: Diagnosis not present

## 2017-09-20 DIAGNOSIS — M62552 Muscle wasting and atrophy, not elsewhere classified, left thigh: Secondary | ICD-10-CM | POA: Diagnosis not present

## 2017-09-20 DIAGNOSIS — M62521 Muscle wasting and atrophy, not elsewhere classified, right upper arm: Secondary | ICD-10-CM | POA: Diagnosis not present

## 2017-09-20 DIAGNOSIS — R2689 Other abnormalities of gait and mobility: Secondary | ICD-10-CM | POA: Diagnosis not present

## 2017-09-20 DIAGNOSIS — M62551 Muscle wasting and atrophy, not elsewhere classified, right thigh: Secondary | ICD-10-CM | POA: Diagnosis not present

## 2017-09-25 DIAGNOSIS — M79602 Pain in left arm: Secondary | ICD-10-CM | POA: Diagnosis not present

## 2017-09-25 DIAGNOSIS — Z6822 Body mass index (BMI) 22.0-22.9, adult: Secondary | ICD-10-CM | POA: Diagnosis not present

## 2017-09-25 DIAGNOSIS — R269 Unspecified abnormalities of gait and mobility: Secondary | ICD-10-CM | POA: Diagnosis not present

## 2017-09-27 DIAGNOSIS — M62521 Muscle wasting and atrophy, not elsewhere classified, right upper arm: Secondary | ICD-10-CM | POA: Diagnosis not present

## 2017-09-27 DIAGNOSIS — M62551 Muscle wasting and atrophy, not elsewhere classified, right thigh: Secondary | ICD-10-CM | POA: Diagnosis not present

## 2017-09-27 DIAGNOSIS — R2689 Other abnormalities of gait and mobility: Secondary | ICD-10-CM | POA: Diagnosis not present

## 2017-09-27 DIAGNOSIS — M62552 Muscle wasting and atrophy, not elsewhere classified, left thigh: Secondary | ICD-10-CM | POA: Diagnosis not present

## 2017-10-03 DIAGNOSIS — M62551 Muscle wasting and atrophy, not elsewhere classified, right thigh: Secondary | ICD-10-CM | POA: Diagnosis not present

## 2017-10-03 DIAGNOSIS — M62521 Muscle wasting and atrophy, not elsewhere classified, right upper arm: Secondary | ICD-10-CM | POA: Diagnosis not present

## 2017-10-03 DIAGNOSIS — R2689 Other abnormalities of gait and mobility: Secondary | ICD-10-CM | POA: Diagnosis not present

## 2017-10-03 DIAGNOSIS — M62552 Muscle wasting and atrophy, not elsewhere classified, left thigh: Secondary | ICD-10-CM | POA: Diagnosis not present

## 2017-10-10 DIAGNOSIS — M62521 Muscle wasting and atrophy, not elsewhere classified, right upper arm: Secondary | ICD-10-CM | POA: Diagnosis not present

## 2017-10-10 DIAGNOSIS — M62552 Muscle wasting and atrophy, not elsewhere classified, left thigh: Secondary | ICD-10-CM | POA: Diagnosis not present

## 2017-10-10 DIAGNOSIS — R2689 Other abnormalities of gait and mobility: Secondary | ICD-10-CM | POA: Diagnosis not present

## 2017-10-10 DIAGNOSIS — M62551 Muscle wasting and atrophy, not elsewhere classified, right thigh: Secondary | ICD-10-CM | POA: Diagnosis not present

## 2017-10-16 DIAGNOSIS — M62551 Muscle wasting and atrophy, not elsewhere classified, right thigh: Secondary | ICD-10-CM | POA: Diagnosis not present

## 2017-10-16 DIAGNOSIS — M62521 Muscle wasting and atrophy, not elsewhere classified, right upper arm: Secondary | ICD-10-CM | POA: Diagnosis not present

## 2017-10-16 DIAGNOSIS — R2689 Other abnormalities of gait and mobility: Secondary | ICD-10-CM | POA: Diagnosis not present

## 2017-10-16 DIAGNOSIS — M62552 Muscle wasting and atrophy, not elsewhere classified, left thigh: Secondary | ICD-10-CM | POA: Diagnosis not present

## 2017-10-24 DIAGNOSIS — M62521 Muscle wasting and atrophy, not elsewhere classified, right upper arm: Secondary | ICD-10-CM | POA: Diagnosis not present

## 2017-10-24 DIAGNOSIS — M62552 Muscle wasting and atrophy, not elsewhere classified, left thigh: Secondary | ICD-10-CM | POA: Diagnosis not present

## 2017-10-24 DIAGNOSIS — R2689 Other abnormalities of gait and mobility: Secondary | ICD-10-CM | POA: Diagnosis not present

## 2017-10-24 DIAGNOSIS — M62551 Muscle wasting and atrophy, not elsewhere classified, right thigh: Secondary | ICD-10-CM | POA: Diagnosis not present

## 2017-10-31 DIAGNOSIS — M62551 Muscle wasting and atrophy, not elsewhere classified, right thigh: Secondary | ICD-10-CM | POA: Diagnosis not present

## 2017-10-31 DIAGNOSIS — R2689 Other abnormalities of gait and mobility: Secondary | ICD-10-CM | POA: Diagnosis not present

## 2017-10-31 DIAGNOSIS — M62521 Muscle wasting and atrophy, not elsewhere classified, right upper arm: Secondary | ICD-10-CM | POA: Diagnosis not present

## 2017-10-31 DIAGNOSIS — M62552 Muscle wasting and atrophy, not elsewhere classified, left thigh: Secondary | ICD-10-CM | POA: Diagnosis not present

## 2017-11-07 DIAGNOSIS — M62552 Muscle wasting and atrophy, not elsewhere classified, left thigh: Secondary | ICD-10-CM | POA: Diagnosis not present

## 2017-11-07 DIAGNOSIS — R2689 Other abnormalities of gait and mobility: Secondary | ICD-10-CM | POA: Diagnosis not present

## 2017-11-07 DIAGNOSIS — M62551 Muscle wasting and atrophy, not elsewhere classified, right thigh: Secondary | ICD-10-CM | POA: Diagnosis not present

## 2017-11-07 DIAGNOSIS — M62521 Muscle wasting and atrophy, not elsewhere classified, right upper arm: Secondary | ICD-10-CM | POA: Diagnosis not present

## 2017-11-14 DIAGNOSIS — M62552 Muscle wasting and atrophy, not elsewhere classified, left thigh: Secondary | ICD-10-CM | POA: Diagnosis not present

## 2017-11-14 DIAGNOSIS — R2689 Other abnormalities of gait and mobility: Secondary | ICD-10-CM | POA: Diagnosis not present

## 2017-11-14 DIAGNOSIS — M62551 Muscle wasting and atrophy, not elsewhere classified, right thigh: Secondary | ICD-10-CM | POA: Diagnosis not present

## 2017-11-14 DIAGNOSIS — M62521 Muscle wasting and atrophy, not elsewhere classified, right upper arm: Secondary | ICD-10-CM | POA: Diagnosis not present

## 2017-11-21 DIAGNOSIS — M62521 Muscle wasting and atrophy, not elsewhere classified, right upper arm: Secondary | ICD-10-CM | POA: Diagnosis not present

## 2017-11-21 DIAGNOSIS — R2689 Other abnormalities of gait and mobility: Secondary | ICD-10-CM | POA: Diagnosis not present

## 2017-11-21 DIAGNOSIS — M62552 Muscle wasting and atrophy, not elsewhere classified, left thigh: Secondary | ICD-10-CM | POA: Diagnosis not present

## 2017-11-21 DIAGNOSIS — M62551 Muscle wasting and atrophy, not elsewhere classified, right thigh: Secondary | ICD-10-CM | POA: Diagnosis not present

## 2017-11-28 DIAGNOSIS — M62551 Muscle wasting and atrophy, not elsewhere classified, right thigh: Secondary | ICD-10-CM | POA: Diagnosis not present

## 2017-11-28 DIAGNOSIS — M62552 Muscle wasting and atrophy, not elsewhere classified, left thigh: Secondary | ICD-10-CM | POA: Diagnosis not present

## 2017-11-28 DIAGNOSIS — R2689 Other abnormalities of gait and mobility: Secondary | ICD-10-CM | POA: Diagnosis not present

## 2017-11-28 DIAGNOSIS — M62521 Muscle wasting and atrophy, not elsewhere classified, right upper arm: Secondary | ICD-10-CM | POA: Diagnosis not present

## 2017-12-05 DIAGNOSIS — M62551 Muscle wasting and atrophy, not elsewhere classified, right thigh: Secondary | ICD-10-CM | POA: Diagnosis not present

## 2017-12-05 DIAGNOSIS — M62552 Muscle wasting and atrophy, not elsewhere classified, left thigh: Secondary | ICD-10-CM | POA: Diagnosis not present

## 2017-12-05 DIAGNOSIS — R2689 Other abnormalities of gait and mobility: Secondary | ICD-10-CM | POA: Diagnosis not present

## 2017-12-05 DIAGNOSIS — M62521 Muscle wasting and atrophy, not elsewhere classified, right upper arm: Secondary | ICD-10-CM | POA: Diagnosis not present

## 2017-12-11 ENCOUNTER — Encounter: Payer: Self-pay | Admitting: Gastroenterology

## 2017-12-12 DIAGNOSIS — M62521 Muscle wasting and atrophy, not elsewhere classified, right upper arm: Secondary | ICD-10-CM | POA: Diagnosis not present

## 2017-12-12 DIAGNOSIS — M62552 Muscle wasting and atrophy, not elsewhere classified, left thigh: Secondary | ICD-10-CM | POA: Diagnosis not present

## 2017-12-12 DIAGNOSIS — R2689 Other abnormalities of gait and mobility: Secondary | ICD-10-CM | POA: Diagnosis not present

## 2017-12-12 DIAGNOSIS — M62551 Muscle wasting and atrophy, not elsewhere classified, right thigh: Secondary | ICD-10-CM | POA: Diagnosis not present

## 2017-12-19 DIAGNOSIS — Z9181 History of falling: Secondary | ICD-10-CM | POA: Diagnosis not present

## 2017-12-19 DIAGNOSIS — M79602 Pain in left arm: Secondary | ICD-10-CM | POA: Diagnosis not present

## 2017-12-19 DIAGNOSIS — E1165 Type 2 diabetes mellitus with hyperglycemia: Secondary | ICD-10-CM | POA: Diagnosis not present

## 2017-12-19 DIAGNOSIS — Z1339 Encounter for screening examination for other mental health and behavioral disorders: Secondary | ICD-10-CM | POA: Diagnosis not present

## 2017-12-19 DIAGNOSIS — Z0000 Encounter for general adult medical examination without abnormal findings: Secondary | ICD-10-CM | POA: Diagnosis not present

## 2017-12-19 DIAGNOSIS — Z6824 Body mass index (BMI) 24.0-24.9, adult: Secondary | ICD-10-CM | POA: Diagnosis not present

## 2017-12-26 DIAGNOSIS — M62552 Muscle wasting and atrophy, not elsewhere classified, left thigh: Secondary | ICD-10-CM | POA: Diagnosis not present

## 2017-12-26 DIAGNOSIS — M62521 Muscle wasting and atrophy, not elsewhere classified, right upper arm: Secondary | ICD-10-CM | POA: Diagnosis not present

## 2017-12-26 DIAGNOSIS — M62551 Muscle wasting and atrophy, not elsewhere classified, right thigh: Secondary | ICD-10-CM | POA: Diagnosis not present

## 2017-12-26 DIAGNOSIS — R2689 Other abnormalities of gait and mobility: Secondary | ICD-10-CM | POA: Diagnosis not present

## 2018-01-13 DIAGNOSIS — M62521 Muscle wasting and atrophy, not elsewhere classified, right upper arm: Secondary | ICD-10-CM | POA: Diagnosis not present

## 2018-01-13 DIAGNOSIS — R2689 Other abnormalities of gait and mobility: Secondary | ICD-10-CM | POA: Diagnosis not present

## 2018-01-13 DIAGNOSIS — M62552 Muscle wasting and atrophy, not elsewhere classified, left thigh: Secondary | ICD-10-CM | POA: Diagnosis not present

## 2018-01-13 DIAGNOSIS — M62551 Muscle wasting and atrophy, not elsewhere classified, right thigh: Secondary | ICD-10-CM | POA: Diagnosis not present

## 2018-01-29 DIAGNOSIS — M62521 Muscle wasting and atrophy, not elsewhere classified, right upper arm: Secondary | ICD-10-CM | POA: Diagnosis not present

## 2018-01-29 DIAGNOSIS — R2689 Other abnormalities of gait and mobility: Secondary | ICD-10-CM | POA: Diagnosis not present

## 2018-01-29 DIAGNOSIS — M62551 Muscle wasting and atrophy, not elsewhere classified, right thigh: Secondary | ICD-10-CM | POA: Diagnosis not present

## 2018-01-29 DIAGNOSIS — M62552 Muscle wasting and atrophy, not elsewhere classified, left thigh: Secondary | ICD-10-CM | POA: Diagnosis not present

## 2018-03-16 DIAGNOSIS — D510 Vitamin B12 deficiency anemia due to intrinsic factor deficiency: Secondary | ICD-10-CM | POA: Diagnosis not present

## 2018-03-16 DIAGNOSIS — E1165 Type 2 diabetes mellitus with hyperglycemia: Secondary | ICD-10-CM | POA: Diagnosis not present

## 2018-03-16 DIAGNOSIS — E785 Hyperlipidemia, unspecified: Secondary | ICD-10-CM | POA: Diagnosis not present

## 2018-04-15 DIAGNOSIS — M79602 Pain in left arm: Secondary | ICD-10-CM | POA: Diagnosis not present

## 2018-04-15 DIAGNOSIS — G8929 Other chronic pain: Secondary | ICD-10-CM | POA: Diagnosis not present

## 2018-04-15 DIAGNOSIS — E1143 Type 2 diabetes mellitus with diabetic autonomic (poly)neuropathy: Secondary | ICD-10-CM | POA: Diagnosis not present

## 2018-04-15 DIAGNOSIS — Z1331 Encounter for screening for depression: Secondary | ICD-10-CM | POA: Diagnosis not present

## 2018-04-15 DIAGNOSIS — M549 Dorsalgia, unspecified: Secondary | ICD-10-CM | POA: Diagnosis not present

## 2018-04-15 DIAGNOSIS — I739 Peripheral vascular disease, unspecified: Secondary | ICD-10-CM | POA: Diagnosis not present

## 2018-04-15 DIAGNOSIS — R5383 Other fatigue: Secondary | ICD-10-CM | POA: Diagnosis not present

## 2018-04-15 DIAGNOSIS — E785 Hyperlipidemia, unspecified: Secondary | ICD-10-CM | POA: Diagnosis not present

## 2018-04-15 DIAGNOSIS — I10 Essential (primary) hypertension: Secondary | ICD-10-CM | POA: Diagnosis not present

## 2018-04-15 DIAGNOSIS — Z6824 Body mass index (BMI) 24.0-24.9, adult: Secondary | ICD-10-CM | POA: Diagnosis not present

## 2018-04-15 DIAGNOSIS — D510 Vitamin B12 deficiency anemia due to intrinsic factor deficiency: Secondary | ICD-10-CM | POA: Diagnosis not present

## 2018-04-22 DIAGNOSIS — M25512 Pain in left shoulder: Secondary | ICD-10-CM | POA: Diagnosis not present

## 2018-06-16 DIAGNOSIS — Z79899 Other long term (current) drug therapy: Secondary | ICD-10-CM | POA: Diagnosis not present

## 2018-06-16 DIAGNOSIS — E785 Hyperlipidemia, unspecified: Secondary | ICD-10-CM | POA: Diagnosis not present

## 2018-06-16 DIAGNOSIS — E1165 Type 2 diabetes mellitus with hyperglycemia: Secondary | ICD-10-CM | POA: Diagnosis not present

## 2018-06-23 DIAGNOSIS — E1169 Type 2 diabetes mellitus with other specified complication: Secondary | ICD-10-CM | POA: Diagnosis not present

## 2018-06-23 DIAGNOSIS — E785 Hyperlipidemia, unspecified: Secondary | ICD-10-CM | POA: Diagnosis not present

## 2018-07-23 DIAGNOSIS — J209 Acute bronchitis, unspecified: Secondary | ICD-10-CM | POA: Diagnosis not present

## 2018-07-24 DIAGNOSIS — I10 Essential (primary) hypertension: Secondary | ICD-10-CM | POA: Diagnosis not present

## 2018-07-24 DIAGNOSIS — E785 Hyperlipidemia, unspecified: Secondary | ICD-10-CM | POA: Diagnosis not present

## 2018-09-23 DIAGNOSIS — E1143 Type 2 diabetes mellitus with diabetic autonomic (poly)neuropathy: Secondary | ICD-10-CM | POA: Diagnosis not present

## 2018-09-23 DIAGNOSIS — E785 Hyperlipidemia, unspecified: Secondary | ICD-10-CM | POA: Diagnosis not present

## 2018-09-23 DIAGNOSIS — E1165 Type 2 diabetes mellitus with hyperglycemia: Secondary | ICD-10-CM | POA: Diagnosis not present

## 2018-10-23 DIAGNOSIS — E785 Hyperlipidemia, unspecified: Secondary | ICD-10-CM | POA: Diagnosis not present

## 2018-10-23 DIAGNOSIS — D510 Vitamin B12 deficiency anemia due to intrinsic factor deficiency: Secondary | ICD-10-CM | POA: Diagnosis not present

## 2018-10-23 DIAGNOSIS — N400 Enlarged prostate without lower urinary tract symptoms: Secondary | ICD-10-CM | POA: Diagnosis not present

## 2018-11-03 DIAGNOSIS — D510 Vitamin B12 deficiency anemia due to intrinsic factor deficiency: Secondary | ICD-10-CM | POA: Diagnosis not present

## 2018-11-03 DIAGNOSIS — I10 Essential (primary) hypertension: Secondary | ICD-10-CM | POA: Diagnosis not present

## 2018-11-03 DIAGNOSIS — Z79899 Other long term (current) drug therapy: Secondary | ICD-10-CM | POA: Diagnosis not present

## 2018-11-03 DIAGNOSIS — E785 Hyperlipidemia, unspecified: Secondary | ICD-10-CM | POA: Diagnosis not present

## 2018-11-13 DIAGNOSIS — G629 Polyneuropathy, unspecified: Secondary | ICD-10-CM | POA: Diagnosis not present

## 2018-11-18 DIAGNOSIS — R269 Unspecified abnormalities of gait and mobility: Secondary | ICD-10-CM | POA: Diagnosis not present

## 2018-11-18 DIAGNOSIS — Z6824 Body mass index (BMI) 24.0-24.9, adult: Secondary | ICD-10-CM | POA: Diagnosis not present

## 2018-11-18 DIAGNOSIS — I739 Peripheral vascular disease, unspecified: Secondary | ICD-10-CM | POA: Diagnosis not present

## 2018-11-18 DIAGNOSIS — E7800 Pure hypercholesterolemia, unspecified: Secondary | ICD-10-CM | POA: Diagnosis not present

## 2018-11-18 DIAGNOSIS — I10 Essential (primary) hypertension: Secondary | ICD-10-CM | POA: Diagnosis not present

## 2018-11-18 DIAGNOSIS — E1143 Type 2 diabetes mellitus with diabetic autonomic (poly)neuropathy: Secondary | ICD-10-CM | POA: Diagnosis not present

## 2018-11-18 DIAGNOSIS — E1159 Type 2 diabetes mellitus with other circulatory complications: Secondary | ICD-10-CM | POA: Diagnosis not present

## 2018-11-22 DIAGNOSIS — E785 Hyperlipidemia, unspecified: Secondary | ICD-10-CM | POA: Diagnosis not present

## 2018-11-22 DIAGNOSIS — E1165 Type 2 diabetes mellitus with hyperglycemia: Secondary | ICD-10-CM | POA: Diagnosis not present

## 2018-12-04 ENCOUNTER — Ambulatory Visit (INDEPENDENT_AMBULATORY_CARE_PROVIDER_SITE_OTHER): Payer: PPO | Admitting: Neurology

## 2018-12-04 ENCOUNTER — Other Ambulatory Visit: Payer: Self-pay

## 2018-12-04 ENCOUNTER — Encounter: Payer: Self-pay | Admitting: Neurology

## 2018-12-04 VITALS — BP 139/75 | HR 99 | Ht 74.0 in | Wt 186.0 lb

## 2018-12-04 DIAGNOSIS — I739 Peripheral vascular disease, unspecified: Secondary | ICD-10-CM

## 2018-12-04 DIAGNOSIS — G3281 Cerebellar ataxia in diseases classified elsewhere: Secondary | ICD-10-CM | POA: Diagnosis not present

## 2018-12-04 DIAGNOSIS — R2689 Other abnormalities of gait and mobility: Secondary | ICD-10-CM

## 2018-12-04 DIAGNOSIS — G63 Polyneuropathy in diseases classified elsewhere: Secondary | ICD-10-CM

## 2018-12-04 DIAGNOSIS — R29898 Other symptoms and signs involving the musculoskeletal system: Secondary | ICD-10-CM | POA: Diagnosis not present

## 2018-12-04 NOTE — Progress Notes (Signed)
Subjective:    Patient ID: Walter [name], male    DOB: December 18, 1940, 78 y.o.   MRN: [id number]  HPI    Walter Age, Walter [name], Walter [name] Walter [name] [address], [address] [place], [place] [address]  Dear Dr. Lin Landsman,  I saw your patient, Levii [name], upon your kind request in my neurologic clinic today for initial consultation of his gait disorder. The patient is accompanied by his wife today. As you know, Walter [name] is a 78 year old right-handed gentleman with an underlying medical history of chronic back pain, BPH, osteoarthritis, diabetes, neuropathy, hypertension, hyperlipidemia, and peripheral artery disease, who reports inability to walk without a walker for the past at least 4 years. I reviewed your office note from 11/18/2018, which you kindly included. Of note, he has had multiple neck and back surgeries, including at least 2 neck surgeries. He had most his surgeries through [facility] He has had extensive PT, as I understand. No recent falls, thankfully. He does not drive a car any longer.He reports that at the time of his last neck surgery his surgeon told him that he would probably be able to walk with a cane, he has not been able to walk with just a cane. He is unable to maneuver stairs. Of note, he retired in 2005 and had his first back surgery in 2006. He also had a right hip replacement in or around 2006. He has had neuropathy for years. He is diabetic and has had reasonable diabetes control, latest A1c around 7 per his report. He does not drink much in the way of water, maybe one cup per day on average, he drinks diet soda, about 2 cans per day and sweet tea about 2 glasses per day. He has not had any one-sided weakness or numbness or tingling or sudden droopy face or slurring of speech. He is a nonsmoker and does not utilize alcohol. He has a 2 wheeled walker. He reports that even physical therapy was not able to get him off the walker. He saw his surgeon  about 2 years ago and was told that there was nothing else they could do.  His Past Medical History Is Significant For: Past Medical History:  Diagnosis Date   Autonomic neuropathy due to diabetes ([facility])    Benign essential hypertension    BPH (benign prostatic hyperplasia)    Chronic back pain    sees Dr.Branch at [facility]   Gait disorder    Hypercholesterolemia    Osteoarthritis    PAD (peripheral artery disease) (HCC)    Pernicious anemia     His Past Surgical History Is Significant For:   His Family History Is Significant For: Family History  Problem Relation [name] of Onset   Stroke Father     His Social History Is Significant For: Social History   Socioeconomic History   Marital status: Single    Spouse name: Not on file   Number of children: Not on file   Years of education: Not on file   Highest education level: Not on file  Occupational History   Not on file  Social Needs   Financial resource strain: Not on file   Food insecurity:    Worry: Not on file    Inability: Not on file   Transportation needs:    Medical: Not on file    Non-medical: Not on file  Tobacco Use   Smoking status: Not on file  Substance and Sexual Activity  Alcohol use: Not on file   Drug use: Not on file   Sexual activity: Not on file  Lifestyle   Physical activity:    Days per week: Not on file    Minutes per session: Not on file   Stress: Not on file  Relationships   Social connections:    Talks on phone: Not on file    Gets together: Not on file    Attends religious service: Not on file    Active member of club or organization: Not on file    Attends meetings of clubs or organizations: Not on file    Relationship status: Not on file  Other Topics Concern   Not on file  Social History Narrative   Not on file    His Allergies Are:  Allergies  Allergen Reactions   Codeine    Hydrocodone    Lipitor [Atorvastatin Calcium]    Sulfa Antibiotics    :   His Current Medications Are:  Outpatient Encounter Medications as of 12/04/2018  Medication Sig   aspirin 81 MG chewable tablet Chew by mouth daily.   DULoxetine (CYMBALTA) 30 MG capsule Take 30 mg by mouth daily.   DULoxetine (CYMBALTA) 60 MG capsule Take 60 mg by mouth daily.   gabapentin (NEURONTIN) 300 MG capsule Take 300-600 mg by mouth. 1 in AM and 2 QHS   glimepiride (AMARYL) 2 MG tablet Take 1 mg by mouth daily with breakfast.   lisinopril (PRINIVIL,ZESTRIL) 2.5 MG tablet Take 2.5 mg by mouth daily.   metFORMIN (GLUCOPHAGE-XR) 500 MG 24 hr tablet Take 500 mg by mouth daily with breakfast.   rosuvastatin (CRESTOR) 5 MG tablet Take 5 mg by mouth daily.   vitamin B-12 (CYANOCOBALAMIN) 1000 MCG tablet Take 1,000 mcg by mouth daily.   Vitamin D, Ergocalciferol, (DRISDOL) 1.25 MG (50000 UT) CAPS capsule Take 50,000 Units by mouth every 7 (seven) days.   No facility-administered encounter medications on file as of 12/04/2018.   :   Review of Systems:  Out of a complete 14 point review of systems, all are reviewed and negative with the exception of these symptoms as listed below:  Review of Systems     Objective:   Physical Exam  Physical Examination:   There were no vitals filed for this visit.  General Examination: The patient is a very pleasant 78 y.o. male in no acute distress. He appears frail and deconditioned. He is well groomed.  HEENT: Normocephalic, atraumatic, pupils are equal, round and reactive to light, extraocular tracking is preserved.airway examination is difficult, he has a rather small mouth opening, moderate mouth dryness is noted, pharyngeal examination is rather difficult. He has no facial masking, dentures in place, no carotid bruits. Hearing is grossly intact. Anterior and midline posterior neck scars are noted. There is limited neck mobility.  Chest: Clear to auscultation without wheezing, rhonchi or crackles noted.  Heart: S1+S2+0,  regular and normal without murmurs, rubs or gallops noted.   Abdomen: Soft, non-tender and non-distended with normal bowel sounds appreciated on auscultation.  Extremities: There is no pitting edema in the distal lower extremities bilaterally.  Skin: Warm and dry without trophic changes noted.  Musculoskeletal: exam reveals no obvious joint deformities, tenderness or joint swelling or erythema.   Neurologically:  Mental status: The patient is awake, alert and oriented in all 4 spheres. His immediate and remote memory, attention, language skills and fund of knowledge are appropriate. There is no evidence of aphasia, agnosia, apraxia or  anomia. Speech is clear with normal prosody and enunciation. Thought process is linear. Mood is normal and affect is normal.  Cranial nerves II - XII are as described above under HEENT exam. In addition: shoulder shrug is normal with equal shoulder height noted. Motor exam: thin bulk, global strength of 4 out of 5 is noted, no tremor is noted, no postural tremor or action tremor or resting tremor. Romberg is not testable safely. Reflexes are 1+ in the upper extremities, trace in the knees and absent in the ankles. Fine motor skills are globally impaired in the upper and lower extremities, no lateralization. Cerebellar testing: No dysmetria or intention tremor on finger to nose testing. Heel to shin is unremarkable bilaterally. There is no truncal or gait ataxia.  Sensory exam: intact to light touch, pinprick, vibration, temperature sense in the upper extremities and reduced to all modalities in the lower extremities up to mid shin area in the right lower extremity and below knee area in the left lower extremity.  Gait, station and balance: He stands with difficulty and has to push himself up, he stands naturally wider based. He has a 2 wheeled walker, he maneuvers it fairly well, he walks slowly and cautiously and slightly wider based. Balance is impaired.      Assessment & Plan:   In summary, Mikie Jerilynn Mages Raschke is a very pleasant 78 y.o.-year old male with an underlying medical history of chronic back pain, BPH, osteoarthritis, diabetes, neuropathy, hypertension, hyperlipidemia, and peripheral artery disease, who presents for evaluation of his gait disorder. On examination, he has a nonspecific gait insecurity and imbalance, most likely secondary to multiple factors including limited mobility in the neck and back area, status post multiple surgeries including multiple neck surgeries and right hip replacement surgery, deconditioning, aging, dehydration/suboptimal hydration, neuropathy, peripheral vascular disease and taking potentially sedating medications. No parkinsonism, no lateralizing findings, no unilateral findings. Unfortunately, there is probably not a whole lot I can do for him, nevertheless, I suggested we proceed with an EMG and nerve conduction test to help confirm the neuropathy. He is diabetic, he is advised to seek optimal diabetes control. He is advised to stay better hydrated with water. He has advised that he has had physical therapy. He is encouraged to use his walker at all times. We will also proceed with a brain MRI to rule out a structural cause for his imbalance. I suggested a follow-up in about 3 months to discuss test results. We will also keep them posted by phone call. I answered all their questions today and the patient and his wife were in agreement. Thank you very much for allowing me to participate in the care of this nice patient. If I can be of any further assistance to you please do not hesitate to call me at [phone].  Sincerely,   Walter Age, Walter [name], Walter [name]

## 2018-12-04 NOTE — Patient Instructions (Addendum)
I believe you have a multifactorial gait disorder, meaning, that it is Walter [name] is due to a combination of factors. These factors include: normal aging, spine disease with multiple spine surgeries, arthritis with hip replacement surgery, dehydration or suboptimal hydration with water, peripheral neuropathy, muscular deconditioning and taking potentially sedating medications including Cymbalta and gabapentin.   Please remember to stand up slowly and get your bearings first turn slowly, no bending down to pick anything, no heavy lifting, be extra careful at night and first thing in the morning. Also, be careful in the Bathroom and the kitchen.   Remember to drink plenty of fluid, eat healthy meals and do not skip any meals. Try to eat protein with a every meal and eat a healthy snack such as fruit or nuts or yogurt in between meals.   Try to drink 6-8 cups of water per day. Please use your walker at all times.  As far as your medications are concerned, I would like to suggest no new medications.   As far as diagnostic testing: I suggest we proceed with a brain scan, called MRI. We will call you with the test results. We will have to schedule you for this on a separate date. This test requires authorization from your insurance, and we will take care of the insurance process.  We will check blood work today and call you with the test results.  We will do an EMG and nerve conduction velocity test, which is an electrical nerve and muscle test, which we will schedule. We will call you with the results.  Follow up in 3 months to discuss test results. We will also call you with interim test results. Unfortunately, there may not be a lot I can offer you to change or improve your condition.

## 2018-12-08 ENCOUNTER — Telehealth: Payer: Self-pay

## 2018-12-08 ENCOUNTER — Telehealth: Payer: Self-pay | Admitting: Neurology

## 2018-12-08 NOTE — Telephone Encounter (Signed)
Health team order sent to GI. No auth they will reach out to the pt to schedule.  °

## 2018-12-08 NOTE — Telephone Encounter (Signed)
-----   Message from Star Age, MD sent at 12/08/2018  8:29 AM EDT ----- Please call pt: Patient's diabetes marker is mildly up, and goal for diabetes is to stay below 7 ideally. His A1c is 7.2 and random blood sugar was elevated at 149. Other labs fine, one marker for autoimmune d/s is pending, will call if abn. I would recommend FU as scheduled with PCP re: ongoing improvement of diabetes control.  FYI: Plan was to proceed with EMG, no appt pending. My note is not showing up for some reason, I know very well that I did it, but it was not saved. I believe, this is from when Epic was down, I will redictate the best I can. Maybe the EMG order was not saved? Michel Bickers

## 2018-12-08 NOTE — Telephone Encounter (Signed)
Called patient and left a voicemail asking for him to call us back!

## 2018-12-08 NOTE — Telephone Encounter (Signed)
Patient is scheduled at [facility] for Wednesday 12/10/18 arrival time is 11:00 AM I did leave a vmail about all of this and I left their number of [phone] incase he needed to r/s for any reason.

## 2018-12-08 NOTE — Progress Notes (Signed)
Please call pt: Patient's diabetes marker is mildly up, and goal for diabetes is to stay below 7 ideally. His A1c is 7.2 and random blood sugar was elevated at 149. Other labs fine, one marker for autoimmune d/s is pending, will call if abn. I would recommend FU as scheduled with PCP re: ongoing improvement of diabetes control.  FYI: Plan was to proceed with EMG, no appt pending. My note is not showing up for some reason, I know very well that I did it, but it was not saved. I believe, this is from when Epic was down, I will redictate the best I can. Maybe the EMG order was not saved? Michel Bickers

## 2018-12-08 NOTE — Telephone Encounter (Signed)
Patient is scheduled at [facility] for Wednesday 12/10/18 arrival time is 11:00 AM I did leave a vmail about all of this and I left their number of [phone] incase he needed to r/s for any reason.

## 2018-12-08 NOTE — Telephone Encounter (Signed)
I called pt and discuss his lab work with him. Pt will discuss his lab work further will Dr. Lin Landsman. Pt is requesting that his MRI be done in [place]. Pt understands that our office will call him to schedule the EMG/NCV. Pt verbalized understanding of results. Pt had no questions at this time but was encouraged to call back if questions arise.

## 2018-12-09 DIAGNOSIS — Z6824 Body mass index (BMI) 24.0-24.9, adult: Secondary | ICD-10-CM | POA: Diagnosis not present

## 2018-12-09 DIAGNOSIS — R269 Unspecified abnormalities of gait and mobility: Secondary | ICD-10-CM | POA: Diagnosis not present

## 2018-12-09 DIAGNOSIS — D510 Vitamin B12 deficiency anemia due to intrinsic factor deficiency: Secondary | ICD-10-CM | POA: Diagnosis not present

## 2018-12-09 LAB — ANA W/REFLEX: Anti Nuclear Antibody(ANA): NEGATIVE

## 2018-12-09 LAB — COMPREHENSIVE METABOLIC PANEL
ALBUMIN: 4.9 g/dL — AB (ref 3.7–4.7)
ALT: 26 IU/L (ref 0–44)
AST: 25 IU/L (ref 0–40)
Albumin/Globulin Ratio: 2.2 (ref 1.2–2.2)
Alkaline Phosphatase: 75 IU/L (ref 39–117)
BUN/Creatinine Ratio: 21 (ref 10–24)
BUN: 21 mg/dL (ref 8–27)
Bilirubin Total: 0.6 mg/dL (ref 0.0–1.2)
CO2: 20 mmol/L (ref 20–29)
Calcium: 9.9 mg/dL (ref 8.6–10.2)
Chloride: 100 mmol/L (ref 96–106)
Creatinine, Ser: 1.02 mg/dL (ref 0.76–1.27)
GFR calc Af Amer: 81 mL/min/{1.73_m2} (ref >59)
GFR calc non Af Amer: 70 mL/min/{1.73_m2} (ref >59)
Globulin, Total: 2.2 g/dL (ref 1.5–4.5)
Glucose: 149 mg/dL — ABNORMAL HIGH (ref 65–99)
Potassium: 5.2 mmol/L (ref 3.5–5.2)
Sodium: 141 mmol/L (ref 134–144)
Total Protein: 7.1 g/dL (ref 6.0–8.5)

## 2018-12-09 LAB — B12 AND FOLATE PANEL

## 2018-12-09 LAB — TSH: TSH: 3.16 u[IU]/mL (ref 0.450–4.500)

## 2018-12-09 LAB — HGB A1C W/O EAG: Hgb A1c MFr Bld: 7.2 % — ABNORMAL HIGH (ref 4.8–5.6)

## 2018-12-10 DIAGNOSIS — R2689 Other abnormalities of gait and mobility: Secondary | ICD-10-CM | POA: Diagnosis not present

## 2018-12-10 DIAGNOSIS — I6782 Cerebral ischemia: Secondary | ICD-10-CM | POA: Diagnosis not present

## 2018-12-11 ENCOUNTER — Telehealth: Payer: Self-pay

## 2018-12-11 NOTE — Telephone Encounter (Signed)
Received MRI results from [facility].

## 2018-12-11 NOTE — Telephone Encounter (Signed)
I received the patient's brain MRI report from [facility] in [place], date of examination was 12/10/2018. Patient had a brain MRI with and without contrast. Impression: No acute intracranial abnormality. Moderate chronic small vessel ischemic disease.  Please call patient regarding the recent brain MRI: The brain scan showed a normal structure of the brain and mild for age volume loss which we call atrophy. There were changes in the deeper structures of the brain, which we call white matter changes or microvascular changes. These were reported as moderate in His case. These are tiny white spots, that occur with time and are seen in a variety of conditions, including with normal aging, chronic hypertension, chronic headaches, especially migraine HAs, chronic diabetes, chronic hyperlipidemia. These are not strokes and no mass or lesion or contrast enhancement was seen which is reassuring. Again, there were no acute findings, such as a stroke, or mass or blood products. No further action is required on this test at this time, other than re-enforcing the importance of good blood pressure control, good cholesterol control, good blood sugar control, and weight management. Please remind patient to keep any upcoming appointments or tests and to call us with any interim questions, concerns, problems or updates. Thanks,  Star Age, MD, PhD

## 2018-12-12 NOTE — Telephone Encounter (Signed)
I called pt and discussed his MRI brain results and Dr. Guadelupe Sabin recommendations with him. Pt verbalized understanding of results. Pt has not been scheduled for his EMG/NCV but is agreeable to this. Pt was transferred to our schedulers and scheduled for his NCV/EMG. Pt had no questions at this time but was encouraged to call back if questions arise.

## 2018-12-17 ENCOUNTER — Encounter: Payer: PPO | Admitting: Neurology

## 2018-12-23 DIAGNOSIS — E1159 Type 2 diabetes mellitus with other circulatory complications: Secondary | ICD-10-CM | POA: Diagnosis not present

## 2018-12-23 DIAGNOSIS — D519 Vitamin B12 deficiency anemia, unspecified: Secondary | ICD-10-CM | POA: Diagnosis not present

## 2018-12-23 DIAGNOSIS — E785 Hyperlipidemia, unspecified: Secondary | ICD-10-CM | POA: Diagnosis not present

## 2019-01-22 DIAGNOSIS — D519 Vitamin B12 deficiency anemia, unspecified: Secondary | ICD-10-CM | POA: Diagnosis not present

## 2019-01-22 DIAGNOSIS — E1143 Type 2 diabetes mellitus with diabetic autonomic (poly)neuropathy: Secondary | ICD-10-CM | POA: Diagnosis not present

## 2019-01-22 DIAGNOSIS — E785 Hyperlipidemia, unspecified: Secondary | ICD-10-CM | POA: Diagnosis not present

## 2019-02-09 ENCOUNTER — Telehealth: Payer: Self-pay

## 2019-02-09 NOTE — Telephone Encounter (Signed)
I called and left a voicemail for patient making him aware that we can go ahead and reschedule his nerve conduction study with Dr. Jannifer Franklin. I advised him to call us back at his convenience.

## 2019-02-21 DIAGNOSIS — E1165 Type 2 diabetes mellitus with hyperglycemia: Secondary | ICD-10-CM | POA: Diagnosis not present

## 2019-02-21 DIAGNOSIS — E1143 Type 2 diabetes mellitus with diabetic autonomic (poly)neuropathy: Secondary | ICD-10-CM | POA: Diagnosis not present

## 2019-02-23 ENCOUNTER — Ambulatory Visit (INDEPENDENT_AMBULATORY_CARE_PROVIDER_SITE_OTHER): Payer: PPO | Admitting: Neurology

## 2019-02-23 ENCOUNTER — Other Ambulatory Visit: Payer: Self-pay

## 2019-02-23 ENCOUNTER — Encounter: Payer: Self-pay | Admitting: Neurology

## 2019-02-23 DIAGNOSIS — G63 Polyneuropathy in diseases classified elsewhere: Secondary | ICD-10-CM

## 2019-02-23 DIAGNOSIS — E1142 Type 2 diabetes mellitus with diabetic polyneuropathy: Secondary | ICD-10-CM | POA: Diagnosis not present

## 2019-02-23 DIAGNOSIS — R2689 Other abnormalities of gait and mobility: Secondary | ICD-10-CM

## 2019-02-23 DIAGNOSIS — R200 Anesthesia of skin: Secondary | ICD-10-CM

## 2019-02-23 DIAGNOSIS — I739 Peripheral vascular disease, unspecified: Secondary | ICD-10-CM

## 2019-02-23 DIAGNOSIS — R29898 Other symptoms and signs involving the musculoskeletal system: Secondary | ICD-10-CM

## 2019-02-23 HISTORY — DX: Type 2 diabetes mellitus with diabetic polyneuropathy: E11.42

## 2019-02-23 NOTE — Progress Notes (Signed)
[place]    Nerve / Sites Muscle Latency Ref. Amplitude Ref. Rel Amp Segments Distance Velocity Ref. Area    ms ms mV mV %  cm m/s m/s mVms  L Peroneal - EDB     Ankle EDB 11.7 ?6.5 0.7 ?2.0 100 Ankle - EDB 9   2.7     Fib head EDB 22.6  0.7  101 Fib head - Ankle 31 28 ?44 3.1     Pop fossa EDB 25.3  0.8  111 Pop fossa - Fib head 10 36 ?44 2.9         Pop fossa - Ankle      R Peroneal - EDB     Ankle EDB 13.4 ?6.5 0.3 ?2.0 100 Ankle - EDB 9   1.3     Fib head EDB 22.4  0.3  87.6 Fib head - Ankle 33 37 ?44 1.2     Pop fossa EDB 25.1  0.3  91.8 Pop fossa - Fib head 10 37 ?44 1.2         Pop fossa - Ankle      L Tibial - AH     Ankle AH 7.9 ?5.8 0.5 ?4.0 100 Ankle - AH 9   4.2     Pop fossa AH 21.9  0.5  111 Pop fossa - Ankle 39 28 ?41 2.9  R Tibial - AH     Ankle AH NR ?5.8 NR ?4.0 NR Ankle - AH 9   NR     Pop fossa AH NR  NR  NR Pop fossa - Ankle 39 NR ?41 NR             SNC    Nerve / Sites Rec. Site Peak Lat Ref.  Amp Ref. Segments Distance    ms ms V V  cm  R Sural - Ankle (Calf)     Calf Ankle NR ?4.4 NR ?6 Calf - Ankle 14  L Sural - Ankle (Calf)     Calf Ankle NR ?4.4 NR ?6 Calf - Ankle 14  R Superficial peroneal - Ankle     Lat leg Ankle NR ?4.4 NR ?6 Lat leg - Ankle 14  L Superficial peroneal - Ankle     Lat leg Ankle NR ?4.4 NR ?6 Lat leg - Ankle 14              F  Wave    Nerve F Lat Ref.   ms ms  L Peroneal - EDB 77.6 ?56.0  L Tibial - AH 75.2 ?56.0  R Peroneal - EDB 75.9 ?56.0

## 2019-02-23 NOTE — Progress Notes (Signed)
Please refer to EMG and nerve conduction procedure note.  

## 2019-02-23 NOTE — Procedures (Signed)
     HISTORY:  Walter [name] is a 78 year old gentleman with a history of diabetes who reports some numbness and slight weakness in the lower extremities, the patient does have some gait instability.  He has been evaluated for possible peripheral neuropathy.  The patient has had prior lumbosacral spine surgery.  NERVE CONDUCTION STUDIES:  Nerve conduction studies were performed on both lower extremities.  The distal motor latencies for the peroneal nerves bilaterally were significantly prolonged with low motor amplitudes seen for these nerves bilaterally.  The distal motor latency for the left posterior tibial nerve was significantly prolonged, unobtainable for the right posterior tibial nerve.  The motor amplitude for the left posterior tibial nerve was low.  Nerve conduction velocities for the peroneal nerves bilaterally and for the left posterior tibial nerve were significantly slowed.  No response was seen for the sural and for the peroneal sensory latencies.  The F-wave latencies for the peroneal nerves bilaterally and for the left posterior tibial nerve were significantly prolonged.  EMG STUDIES:  EMG study was performed on the left lower extremity:  The tibialis anterior muscle reveals 2 to 4K motor units with full recruitment. No fibrillations or positive waves were seen. The peroneus tertius muscle reveals 2 to 4K motor units with decreased recruitment. No fibrillations or positive waves were seen. The medial gastrocnemius muscle reveals 1 to 3K motor units with decreased recruitment. No fibrillations or positive waves were seen. The vastus lateralis muscle reveals 2 to 4K motor units with decreased recruitment. No fibrillations or positive waves were seen. The iliopsoas muscle reveals 2 to 4K motor units with full recruitment. No fibrillations or positive waves were seen. The biceps femoris muscle (long head) reveals 2 to 4K motor units with full recruitment. No fibrillations or  positive waves were seen. The lumbosacral paraspinal muscles were tested at 3 levels, and revealed no abnormalities of insertional activity at all 3 levels tested. There was good relaxation.   IMPRESSION:  Nerve conduction studies done on both lower extremities shows evidence of a significant peripheral neuropathy with mixed demyelinating and axonal features.  EMG evaluation of the left lower extremity shows some mild chronic stable distal signs of neuropathic denervation consistent with a diagnosis of peripheral neuropathy.  There is some evidence of overlying chronic L4 radiculopathy.  Jill Alexanders MD 02/23/2019 4:17 PM  [facility] [address] [place], [place] [address]  Phone [phone] Fax [phone]

## 2019-02-25 ENCOUNTER — Telehealth: Payer: Self-pay

## 2019-02-25 NOTE — Telephone Encounter (Signed)
-----   Message from Star Age, MD sent at 02/25/2019  8:44 AM EDT ----- Please advise patient that his nerve and muscle test done in our office recently confirms the suspected neuropathy, ie. nerve damage.  He has findings that are chronic and overall in the moderate range.  As discussed, optimal diabetes control is key.  Neuropathy can contribute to balance issues and fall risk.  There is also some evidence of chronic lower spine pinched nerve type findings which we call radiculopathy at the level L4.  He does have a longer standing history of degenerative spine disease.  Overall, no obvious reason to change anything in his management other than ongoing healthy lifestyle, fall prevention, and optimizing diabetes control.  He can follow-up with his primary care physician at this point, or I can see him in FU if he wishes, for a recheck.

## 2019-02-25 NOTE — Telephone Encounter (Signed)
I called pt, discussed his NCV/EMG results and Dr. Guadelupe Sabin recommendations. Pt declined a follow up with [facility] right now and will discuss further options with Dr. Lin Landsman. Pt verbalized understanding of results. Pt had no questions at this time but was encouraged to call back if questions arise.

## 2019-02-25 NOTE — Progress Notes (Signed)
Please advise patient that his nerve and muscle test done in our office recently confirms the suspected neuropathy, ie. nerve damage.  He has findings that are chronic and overall in the moderate range.  As discussed, optimal diabetes control is key.  Neuropathy can contribute to balance issues and fall risk.  There is also some evidence of chronic lower spine pinched nerve type findings which we call radiculopathy at the level L4.  He does have a longer standing history of degenerative spine disease.  Overall, no obvious reason to change anything in his management other than ongoing healthy lifestyle, fall prevention, and optimizing diabetes control.  He can follow-up with his primary care physician at this point, or I can see him in FU if he wishes, for a recheck.

## 2019-03-05 DIAGNOSIS — Z6822 Body mass index (BMI) 22.0-22.9, adult: Secondary | ICD-10-CM | POA: Diagnosis not present

## 2019-03-05 DIAGNOSIS — N400 Enlarged prostate without lower urinary tract symptoms: Secondary | ICD-10-CM | POA: Diagnosis not present

## 2019-03-05 DIAGNOSIS — Z0000 Encounter for general adult medical examination without abnormal findings: Secondary | ICD-10-CM | POA: Diagnosis not present

## 2019-03-05 DIAGNOSIS — M549 Dorsalgia, unspecified: Secondary | ICD-10-CM | POA: Diagnosis not present

## 2019-03-05 DIAGNOSIS — E7800 Pure hypercholesterolemia, unspecified: Secondary | ICD-10-CM | POA: Diagnosis not present

## 2019-03-05 DIAGNOSIS — E1143 Type 2 diabetes mellitus with diabetic autonomic (poly)neuropathy: Secondary | ICD-10-CM | POA: Diagnosis not present

## 2019-03-05 DIAGNOSIS — M8580 Other specified disorders of bone density and structure, unspecified site: Secondary | ICD-10-CM | POA: Diagnosis not present

## 2019-03-05 DIAGNOSIS — E559 Vitamin D deficiency, unspecified: Secondary | ICD-10-CM | POA: Diagnosis not present

## 2019-03-05 DIAGNOSIS — M810 Age-related osteoporosis without current pathological fracture: Secondary | ICD-10-CM | POA: Diagnosis not present

## 2019-03-05 DIAGNOSIS — G8929 Other chronic pain: Secondary | ICD-10-CM | POA: Diagnosis not present

## 2019-03-18 DIAGNOSIS — E1142 Type 2 diabetes mellitus with diabetic polyneuropathy: Secondary | ICD-10-CM | POA: Diagnosis not present

## 2019-03-18 DIAGNOSIS — I739 Peripheral vascular disease, unspecified: Secondary | ICD-10-CM | POA: Diagnosis not present

## 2019-03-23 DIAGNOSIS — R2689 Other abnormalities of gait and mobility: Secondary | ICD-10-CM | POA: Diagnosis not present

## 2019-03-23 DIAGNOSIS — I739 Peripheral vascular disease, unspecified: Secondary | ICD-10-CM | POA: Diagnosis not present

## 2019-03-24 DIAGNOSIS — I10 Essential (primary) hypertension: Secondary | ICD-10-CM | POA: Diagnosis not present

## 2019-03-24 DIAGNOSIS — D519 Vitamin B12 deficiency anemia, unspecified: Secondary | ICD-10-CM | POA: Diagnosis not present

## 2019-04-24 ENCOUNTER — Other Ambulatory Visit: Payer: Self-pay

## 2019-04-24 DIAGNOSIS — E785 Hyperlipidemia, unspecified: Secondary | ICD-10-CM | POA: Diagnosis not present

## 2019-04-24 DIAGNOSIS — E1165 Type 2 diabetes mellitus with hyperglycemia: Secondary | ICD-10-CM | POA: Diagnosis not present

## 2019-06-24 DIAGNOSIS — E1165 Type 2 diabetes mellitus with hyperglycemia: Secondary | ICD-10-CM | POA: Diagnosis not present

## 2019-06-24 DIAGNOSIS — R269 Unspecified abnormalities of gait and mobility: Secondary | ICD-10-CM | POA: Diagnosis not present

## 2019-06-24 DIAGNOSIS — D519 Vitamin B12 deficiency anemia, unspecified: Secondary | ICD-10-CM | POA: Diagnosis not present

## 2019-06-30 DIAGNOSIS — Z79899 Other long term (current) drug therapy: Secondary | ICD-10-CM | POA: Diagnosis not present

## 2019-06-30 DIAGNOSIS — G8929 Other chronic pain: Secondary | ICD-10-CM | POA: Diagnosis not present

## 2019-06-30 DIAGNOSIS — Z9181 History of falling: Secondary | ICD-10-CM | POA: Diagnosis not present

## 2019-06-30 DIAGNOSIS — M549 Dorsalgia, unspecified: Secondary | ICD-10-CM | POA: Diagnosis not present

## 2019-06-30 DIAGNOSIS — Z6823 Body mass index (BMI) 23.0-23.9, adult: Secondary | ICD-10-CM | POA: Diagnosis not present

## 2019-06-30 DIAGNOSIS — R5383 Other fatigue: Secondary | ICD-10-CM | POA: Diagnosis not present

## 2019-06-30 DIAGNOSIS — E785 Hyperlipidemia, unspecified: Secondary | ICD-10-CM | POA: Diagnosis not present

## 2019-06-30 DIAGNOSIS — X32XXXA Exposure to sunlight, initial encounter: Secondary | ICD-10-CM | POA: Diagnosis not present

## 2019-06-30 DIAGNOSIS — L570 Actinic keratosis: Secondary | ICD-10-CM | POA: Diagnosis not present

## 2019-06-30 DIAGNOSIS — R269 Unspecified abnormalities of gait and mobility: Secondary | ICD-10-CM | POA: Diagnosis not present

## 2019-06-30 DIAGNOSIS — Z23 Encounter for immunization: Secondary | ICD-10-CM | POA: Diagnosis not present

## 2019-06-30 DIAGNOSIS — E1165 Type 2 diabetes mellitus with hyperglycemia: Secondary | ICD-10-CM | POA: Diagnosis not present

## 2019-07-15 DIAGNOSIS — I10 Essential (primary) hypertension: Secondary | ICD-10-CM | POA: Diagnosis not present

## 2019-07-15 DIAGNOSIS — Z6823 Body mass index (BMI) 23.0-23.9, adult: Secondary | ICD-10-CM | POA: Diagnosis not present

## 2019-07-15 DIAGNOSIS — L821 Other seborrheic keratosis: Secondary | ICD-10-CM | POA: Diagnosis not present

## 2019-07-24 DIAGNOSIS — E7800 Pure hypercholesterolemia, unspecified: Secondary | ICD-10-CM | POA: Diagnosis not present

## 2019-07-24 DIAGNOSIS — E1165 Type 2 diabetes mellitus with hyperglycemia: Secondary | ICD-10-CM | POA: Diagnosis not present

## 2019-07-28 DIAGNOSIS — L821 Other seborrheic keratosis: Secondary | ICD-10-CM | POA: Diagnosis not present

## 2019-07-28 DIAGNOSIS — L578 Other skin changes due to chronic exposure to nonionizing radiation: Secondary | ICD-10-CM | POA: Diagnosis not present

## 2019-07-28 DIAGNOSIS — L570 Actinic keratosis: Secondary | ICD-10-CM | POA: Diagnosis not present

## 2019-08-24 DIAGNOSIS — E7800 Pure hypercholesterolemia, unspecified: Secondary | ICD-10-CM | POA: Diagnosis not present

## 2019-08-24 DIAGNOSIS — I10 Essential (primary) hypertension: Secondary | ICD-10-CM | POA: Diagnosis not present

## 2019-08-24 DIAGNOSIS — E1165 Type 2 diabetes mellitus with hyperglycemia: Secondary | ICD-10-CM | POA: Diagnosis not present

## 2019-09-28 DIAGNOSIS — L578 Other skin changes due to chronic exposure to nonionizing radiation: Secondary | ICD-10-CM | POA: Diagnosis not present

## 2019-09-28 DIAGNOSIS — L570 Actinic keratosis: Secondary | ICD-10-CM | POA: Diagnosis not present

## 2019-09-28 DIAGNOSIS — L853 Xerosis cutis: Secondary | ICD-10-CM | POA: Diagnosis not present

## 2019-09-28 DIAGNOSIS — L821 Other seborrheic keratosis: Secondary | ICD-10-CM | POA: Diagnosis not present

## 2019-10-24 DIAGNOSIS — E1165 Type 2 diabetes mellitus with hyperglycemia: Secondary | ICD-10-CM | POA: Diagnosis not present

## 2019-10-24 DIAGNOSIS — I10 Essential (primary) hypertension: Secondary | ICD-10-CM | POA: Diagnosis not present

## 2019-10-24 DIAGNOSIS — E7800 Pure hypercholesterolemia, unspecified: Secondary | ICD-10-CM | POA: Diagnosis not present

## 2019-10-26 DIAGNOSIS — Z79899 Other long term (current) drug therapy: Secondary | ICD-10-CM | POA: Diagnosis not present

## 2019-11-22 DIAGNOSIS — I10 Essential (primary) hypertension: Secondary | ICD-10-CM | POA: Diagnosis not present

## 2019-11-22 DIAGNOSIS — E7800 Pure hypercholesterolemia, unspecified: Secondary | ICD-10-CM | POA: Diagnosis not present

## 2019-11-22 DIAGNOSIS — E119 Type 2 diabetes mellitus without complications: Secondary | ICD-10-CM | POA: Diagnosis not present

## 2019-12-23 DIAGNOSIS — E7800 Pure hypercholesterolemia, unspecified: Secondary | ICD-10-CM | POA: Diagnosis not present

## 2019-12-23 DIAGNOSIS — I10 Essential (primary) hypertension: Secondary | ICD-10-CM | POA: Diagnosis not present

## 2020-01-22 DIAGNOSIS — D519 Vitamin B12 deficiency anemia, unspecified: Secondary | ICD-10-CM | POA: Diagnosis not present

## 2020-01-22 DIAGNOSIS — E1165 Type 2 diabetes mellitus with hyperglycemia: Secondary | ICD-10-CM | POA: Diagnosis not present

## 2020-01-22 DIAGNOSIS — E7800 Pure hypercholesterolemia, unspecified: Secondary | ICD-10-CM | POA: Diagnosis not present

## 2020-02-05 DIAGNOSIS — Z6822 Body mass index (BMI) 22.0-22.9, adult: Secondary | ICD-10-CM | POA: Diagnosis not present

## 2020-02-05 DIAGNOSIS — L89309 Pressure ulcer of unspecified buttock, unspecified stage: Secondary | ICD-10-CM | POA: Diagnosis not present

## 2020-02-05 DIAGNOSIS — I10 Essential (primary) hypertension: Secondary | ICD-10-CM | POA: Diagnosis not present

## 2020-02-15 DIAGNOSIS — E119 Type 2 diabetes mellitus without complications: Secondary | ICD-10-CM | POA: Diagnosis not present

## 2020-02-15 DIAGNOSIS — L89322 Pressure ulcer of left buttock, stage 2: Secondary | ICD-10-CM | POA: Diagnosis not present

## 2020-02-16 DIAGNOSIS — L89322 Pressure ulcer of left buttock, stage 2: Secondary | ICD-10-CM | POA: Diagnosis not present

## 2020-02-22 DIAGNOSIS — E7800 Pure hypercholesterolemia, unspecified: Secondary | ICD-10-CM | POA: Diagnosis not present

## 2020-02-22 DIAGNOSIS — I10 Essential (primary) hypertension: Secondary | ICD-10-CM | POA: Diagnosis not present

## 2020-02-22 DIAGNOSIS — E1165 Type 2 diabetes mellitus with hyperglycemia: Secondary | ICD-10-CM | POA: Diagnosis not present

## 2020-02-24 DIAGNOSIS — L89322 Pressure ulcer of left buttock, stage 2: Secondary | ICD-10-CM | POA: Diagnosis not present

## 2020-02-24 DIAGNOSIS — E119 Type 2 diabetes mellitus without complications: Secondary | ICD-10-CM | POA: Diagnosis not present

## 2020-03-02 DIAGNOSIS — L89322 Pressure ulcer of left buttock, stage 2: Secondary | ICD-10-CM | POA: Diagnosis not present

## 2020-03-02 DIAGNOSIS — E119 Type 2 diabetes mellitus without complications: Secondary | ICD-10-CM | POA: Diagnosis not present

## 2020-03-16 DIAGNOSIS — L570 Actinic keratosis: Secondary | ICD-10-CM | POA: Diagnosis not present

## 2020-03-16 DIAGNOSIS — L578 Other skin changes due to chronic exposure to nonionizing radiation: Secondary | ICD-10-CM | POA: Diagnosis not present

## 2020-03-16 DIAGNOSIS — D225 Melanocytic nevi of trunk: Secondary | ICD-10-CM | POA: Diagnosis not present

## 2020-03-16 DIAGNOSIS — L814 Other melanin hyperpigmentation: Secondary | ICD-10-CM | POA: Diagnosis not present

## 2020-03-16 DIAGNOSIS — D1801 Hemangioma of skin and subcutaneous tissue: Secondary | ICD-10-CM | POA: Diagnosis not present

## 2020-03-23 DIAGNOSIS — E7800 Pure hypercholesterolemia, unspecified: Secondary | ICD-10-CM | POA: Diagnosis not present

## 2020-03-23 DIAGNOSIS — E1165 Type 2 diabetes mellitus with hyperglycemia: Secondary | ICD-10-CM | POA: Diagnosis not present

## 2020-03-23 DIAGNOSIS — I10 Essential (primary) hypertension: Secondary | ICD-10-CM | POA: Diagnosis not present

## 2020-04-18 DIAGNOSIS — Z79899 Other long term (current) drug therapy: Secondary | ICD-10-CM | POA: Diagnosis not present

## 2020-04-18 DIAGNOSIS — E559 Vitamin D deficiency, unspecified: Secondary | ICD-10-CM | POA: Diagnosis not present

## 2020-04-18 DIAGNOSIS — E1165 Type 2 diabetes mellitus with hyperglycemia: Secondary | ICD-10-CM | POA: Diagnosis not present

## 2020-04-23 DIAGNOSIS — E7800 Pure hypercholesterolemia, unspecified: Secondary | ICD-10-CM | POA: Diagnosis not present

## 2020-04-23 DIAGNOSIS — E1165 Type 2 diabetes mellitus with hyperglycemia: Secondary | ICD-10-CM | POA: Diagnosis not present

## 2020-04-23 DIAGNOSIS — I10 Essential (primary) hypertension: Secondary | ICD-10-CM | POA: Diagnosis not present

## 2020-05-24 DIAGNOSIS — E7800 Pure hypercholesterolemia, unspecified: Secondary | ICD-10-CM | POA: Diagnosis not present

## 2020-05-24 DIAGNOSIS — E1165 Type 2 diabetes mellitus with hyperglycemia: Secondary | ICD-10-CM | POA: Diagnosis not present

## 2020-05-24 DIAGNOSIS — I10 Essential (primary) hypertension: Secondary | ICD-10-CM | POA: Diagnosis not present

## 2020-06-23 DIAGNOSIS — E1165 Type 2 diabetes mellitus with hyperglycemia: Secondary | ICD-10-CM | POA: Diagnosis not present

## 2020-06-23 DIAGNOSIS — E7800 Pure hypercholesterolemia, unspecified: Secondary | ICD-10-CM | POA: Diagnosis not present

## 2020-06-23 DIAGNOSIS — I10 Essential (primary) hypertension: Secondary | ICD-10-CM | POA: Diagnosis not present

## 2020-07-23 DIAGNOSIS — I10 Essential (primary) hypertension: Secondary | ICD-10-CM | POA: Diagnosis not present

## 2020-07-23 DIAGNOSIS — E7800 Pure hypercholesterolemia, unspecified: Secondary | ICD-10-CM | POA: Diagnosis not present

## 2020-07-23 DIAGNOSIS — E1165 Type 2 diabetes mellitus with hyperglycemia: Secondary | ICD-10-CM | POA: Diagnosis not present

## 2020-08-23 DIAGNOSIS — I10 Essential (primary) hypertension: Secondary | ICD-10-CM | POA: Diagnosis not present

## 2020-08-23 DIAGNOSIS — E7800 Pure hypercholesterolemia, unspecified: Secondary | ICD-10-CM | POA: Diagnosis not present

## 2020-08-23 DIAGNOSIS — E1165 Type 2 diabetes mellitus with hyperglycemia: Secondary | ICD-10-CM | POA: Diagnosis not present

## 2020-09-22 DIAGNOSIS — E1165 Type 2 diabetes mellitus with hyperglycemia: Secondary | ICD-10-CM | POA: Diagnosis not present

## 2020-09-22 DIAGNOSIS — E7800 Pure hypercholesterolemia, unspecified: Secondary | ICD-10-CM | POA: Diagnosis not present

## 2020-09-22 DIAGNOSIS — I10 Essential (primary) hypertension: Secondary | ICD-10-CM | POA: Diagnosis not present

## 2020-10-23 DIAGNOSIS — I10 Essential (primary) hypertension: Secondary | ICD-10-CM | POA: Diagnosis not present

## 2020-10-23 DIAGNOSIS — E1165 Type 2 diabetes mellitus with hyperglycemia: Secondary | ICD-10-CM | POA: Diagnosis not present

## 2020-10-23 DIAGNOSIS — E7800 Pure hypercholesterolemia, unspecified: Secondary | ICD-10-CM | POA: Diagnosis not present

## 2020-11-07 DIAGNOSIS — G8929 Other chronic pain: Secondary | ICD-10-CM | POA: Diagnosis not present

## 2020-11-07 DIAGNOSIS — M25511 Pain in right shoulder: Secondary | ICD-10-CM | POA: Diagnosis not present

## 2020-11-21 DIAGNOSIS — E7800 Pure hypercholesterolemia, unspecified: Secondary | ICD-10-CM | POA: Diagnosis not present

## 2020-11-21 DIAGNOSIS — E1165 Type 2 diabetes mellitus with hyperglycemia: Secondary | ICD-10-CM | POA: Diagnosis not present

## 2020-11-21 DIAGNOSIS — I10 Essential (primary) hypertension: Secondary | ICD-10-CM | POA: Diagnosis not present

## 2020-12-21 DIAGNOSIS — E1165 Type 2 diabetes mellitus with hyperglycemia: Secondary | ICD-10-CM | POA: Diagnosis not present

## 2020-12-21 DIAGNOSIS — E7800 Pure hypercholesterolemia, unspecified: Secondary | ICD-10-CM | POA: Diagnosis not present

## 2020-12-21 DIAGNOSIS — I10 Essential (primary) hypertension: Secondary | ICD-10-CM | POA: Diagnosis not present

## 2021-01-21 DIAGNOSIS — E1165 Type 2 diabetes mellitus with hyperglycemia: Secondary | ICD-10-CM | POA: Diagnosis not present

## 2021-01-21 DIAGNOSIS — I10 Essential (primary) hypertension: Secondary | ICD-10-CM | POA: Diagnosis not present

## 2021-01-21 DIAGNOSIS — E7800 Pure hypercholesterolemia, unspecified: Secondary | ICD-10-CM | POA: Diagnosis not present

## 2021-04-10 DIAGNOSIS — R1031 Right lower quadrant pain: Secondary | ICD-10-CM | POA: Diagnosis not present

## 2021-04-23 DIAGNOSIS — E1165 Type 2 diabetes mellitus with hyperglycemia: Secondary | ICD-10-CM | POA: Diagnosis not present

## 2021-04-23 DIAGNOSIS — I10 Essential (primary) hypertension: Secondary | ICD-10-CM | POA: Diagnosis not present

## 2021-04-23 DIAGNOSIS — E7800 Pure hypercholesterolemia, unspecified: Secondary | ICD-10-CM | POA: Diagnosis not present

## 2021-06-23 DIAGNOSIS — I10 Essential (primary) hypertension: Secondary | ICD-10-CM | POA: Diagnosis not present

## 2021-06-23 DIAGNOSIS — E7800 Pure hypercholesterolemia, unspecified: Secondary | ICD-10-CM | POA: Diagnosis not present

## 2021-07-24 DIAGNOSIS — I10 Essential (primary) hypertension: Secondary | ICD-10-CM | POA: Diagnosis not present

## 2021-07-24 DIAGNOSIS — E7800 Pure hypercholesterolemia, unspecified: Secondary | ICD-10-CM | POA: Diagnosis not present

## 2021-07-24 DIAGNOSIS — E1165 Type 2 diabetes mellitus with hyperglycemia: Secondary | ICD-10-CM | POA: Diagnosis not present

## 2021-08-23 DIAGNOSIS — E1165 Type 2 diabetes mellitus with hyperglycemia: Secondary | ICD-10-CM | POA: Diagnosis not present

## 2021-08-23 DIAGNOSIS — E7800 Pure hypercholesterolemia, unspecified: Secondary | ICD-10-CM | POA: Diagnosis not present

## 2021-08-23 DIAGNOSIS — I10 Essential (primary) hypertension: Secondary | ICD-10-CM | POA: Diagnosis not present

## 2021-08-25 DIAGNOSIS — E7800 Pure hypercholesterolemia, unspecified: Secondary | ICD-10-CM | POA: Diagnosis not present

## 2021-08-25 DIAGNOSIS — I739 Peripheral vascular disease, unspecified: Secondary | ICD-10-CM | POA: Diagnosis not present

## 2021-08-25 DIAGNOSIS — G8929 Other chronic pain: Secondary | ICD-10-CM | POA: Diagnosis not present

## 2021-08-25 DIAGNOSIS — R269 Unspecified abnormalities of gait and mobility: Secondary | ICD-10-CM | POA: Diagnosis not present

## 2021-08-25 DIAGNOSIS — M549 Dorsalgia, unspecified: Secondary | ICD-10-CM | POA: Diagnosis not present

## 2021-08-25 DIAGNOSIS — Z9181 History of falling: Secondary | ICD-10-CM | POA: Diagnosis not present

## 2021-08-25 DIAGNOSIS — Z0000 Encounter for general adult medical examination without abnormal findings: Secondary | ICD-10-CM | POA: Diagnosis not present

## 2021-08-25 DIAGNOSIS — Z1331 Encounter for screening for depression: Secondary | ICD-10-CM | POA: Diagnosis not present

## 2021-08-25 DIAGNOSIS — G629 Polyneuropathy, unspecified: Secondary | ICD-10-CM | POA: Diagnosis not present

## 2021-08-25 DIAGNOSIS — Z6822 Body mass index (BMI) 22.0-22.9, adult: Secondary | ICD-10-CM | POA: Diagnosis not present

## 2021-08-25 DIAGNOSIS — E1165 Type 2 diabetes mellitus with hyperglycemia: Secondary | ICD-10-CM | POA: Diagnosis not present

## 2021-09-07 DIAGNOSIS — R1031 Right lower quadrant pain: Secondary | ICD-10-CM | POA: Diagnosis not present

## 2021-09-08 DIAGNOSIS — I739 Peripheral vascular disease, unspecified: Secondary | ICD-10-CM | POA: Diagnosis not present

## 2021-09-22 DIAGNOSIS — E1165 Type 2 diabetes mellitus with hyperglycemia: Secondary | ICD-10-CM | POA: Diagnosis not present

## 2021-09-22 DIAGNOSIS — E7800 Pure hypercholesterolemia, unspecified: Secondary | ICD-10-CM | POA: Diagnosis not present

## 2021-09-22 DIAGNOSIS — I10 Essential (primary) hypertension: Secondary | ICD-10-CM | POA: Diagnosis not present

## 2021-10-24 DIAGNOSIS — I10 Essential (primary) hypertension: Secondary | ICD-10-CM | POA: Diagnosis not present

## 2021-10-24 DIAGNOSIS — E1165 Type 2 diabetes mellitus with hyperglycemia: Secondary | ICD-10-CM | POA: Diagnosis not present

## 2021-10-24 DIAGNOSIS — E7800 Pure hypercholesterolemia, unspecified: Secondary | ICD-10-CM | POA: Diagnosis not present

## 2021-10-25 NOTE — Progress Notes (Signed)
Office Note     CC: Concern for peripheral arterial disease Requesting Provider:  Angelina Sheriff, MD  HPI: Walter [name] is a 81 y.o. (1941-07-30) male presenting at the request of .Lin Landsman, Angelique Blonder, MD for concern for peripheral arterial disease.  On exam, patient was doing well, accompanied by his wife.  An [place] native, he worked 37 years at the [employer].  Since retirement, has had several medical issues and surgeries.  He is currently ambulating using a walker has seen a significant decline in his ambulatory status over the last couple of years.  He denies symptoms of claudication, rest pain, tissue loss.  He does have some neuropathy from longstanding diabetes Both he and his wife's major concern was erythema in bilateral feet, cool to the touch.   The pt is  on a statin for cholesterol management.  The pt is not on a daily aspirin.   Other AC:   The pt is  on medication for hypertension.   The pt is  diabetic.  Tobacco hx:  former  Past Medical History:  Diagnosis Date   Autonomic neuropathy due to diabetes ([place])    Benign essential hypertension    BPH (benign prostatic hyperplasia)    Chronic back pain    sees Dr.Branch at [facility]   Diabetic peripheral neuropathy ([place]) 02/23/2019   Gait disorder    Hypercholesterolemia    Osteoarthritis    PAD (peripheral artery disease) ([facility])    Pernicious anemia     Past Surgical History:  Procedure Laterality Date   CERVICAL DISCECTOMY     LAMINECTOMY     THORACIC DISCECTOMY     TOTAL HIP ARTHROPLASTY Right    UMBILICAL HERNIA REPAIR      Social History   Socioeconomic History   Marital status: Single    Spouse name: Not on file   Number of children: Not on file   Years of education: Not on file   Highest education level: Not on file  Occupational History   Not on file  Tobacco Use   Smoking status: Never   Smokeless tobacco: Never  Substance and Sexual Activity   Alcohol use: Not Currently   Drug use:  Not Currently   Sexual activity: Not on file  Other Topics Concern   Not on file  Social History Narrative   Not on file   Social Determinants of Health   Financial Resource Strain: Not on file  Food Insecurity: Not on file  Transportation Needs: Not on file  Physical Activity: Not on file  Stress: Not on file  Social Connections: Not on file  Intimate Partner Violence: Not on file    Family History  Problem Relation Age of Onset   Stroke Father     Current Outpatient Medications  Medication Sig Dispense Refill   diclofenac (VOLTAREN) 75 MG EC tablet Take 75 mg by mouth daily as needed.     DULoxetine (CYMBALTA) 30 MG capsule Take 30 mg by mouth daily.     DULoxetine (CYMBALTA) 60 MG capsule Take 60 mg by mouth daily.     empagliflozin (JARDIANCE) 10 MG TABS tablet Take 10 mg by mouth daily.     gabapentin (NEURONTIN) 300 MG capsule Take 300 mg by mouth 3 (three) times daily. 1 in AM and 2 QHS      lisinopril (PRINIVIL,ZESTRIL) 2.5 MG tablet Take 2.5 mg by mouth daily.     metFORMIN (GLUCOPHAGE-XR) 500 MG 24 hr tablet  Take 2,000 mg by mouth every evening.      rosuvastatin (CRESTOR) 5 MG tablet Take 5 mg by mouth daily.     vitamin B-12 (CYANOCOBALAMIN) 1000 MCG tablet Take 1,000 mcg by mouth daily.     Vitamin D, Ergocalciferol, (DRISDOL) 1.25 MG (50000 UT) CAPS capsule Take 50,000 Units by mouth every 7 (seven) days.     No current facility-administered medications for this visit.    Allergies  Allergen Reactions   Codeine    Hydrocodone    Lipitor [Atorvastatin Calcium]    Sulfa Antibiotics      REVIEW OF SYSTEMS:   [X]  denotes positive finding, [ ]  denotes negative finding Cardiac  Comments:  Chest pain or chest pressure:    Shortness of breath upon exertion:    Short of breath when lying flat:    Irregular heart rhythm:        Vascular    Pain in calf, thigh, or hip brought on by ambulation:    Pain in feet at night that wakes you up from your sleep:      Blood clot in your veins:    Leg swelling:         Pulmonary    Oxygen at home:    Productive cough:     Wheezing:         Neurologic    Sudden weakness in arms or legs:     Sudden numbness in arms or legs:     Sudden onset of difficulty speaking or slurred speech:    Temporary loss of vision in one eye:     Problems with dizziness:         Gastrointestinal    Blood in stool:     Vomited blood:         Genitourinary    Burning when urinating:     Blood in urine:        Psychiatric    Major depression:         Hematologic    Bleeding problems:    Problems with blood clotting too easily:        Skin    Rashes or ulcers:        Constitutional    Fever or chills:      PHYSICAL EXAMINATION:  There were no vitals filed for this visit.  General:  WDWN in NAD; vital signs documented above Gait: Not observed HENT: WNL, normocephalic Pulmonary: normal non-labored breathing , without wheezing Cardiac: regular HR, Abdomen: soft, NT, no masses Skin: without rashes Vascular Exam/Pulses:  Right Left  Radial 2+ (normal) 2+ (normal)  Ulnar 2+ (normal) 2+ (normal)  Femoral    Popliteal    DP 2+ (normal) 2+ (normal)  PT     Extremities: without ischemic changes, without Gangrene , without cellulitis; without open wounds;  Musculoskeletal: no muscle wasting or atrophy  Neurologic: A&O X 3;  No focal weakness or paresthesias are detected Psychiatric:  The pt has Normal affect.   Non-Invasive Vascular Imaging:   Summary:  Right: Resting right ankle-brachial index is within normal range. No  evidence of significant right lower extremity arterial disease. The right  toe-brachial index is abnormal.   Left: Resting left ankle-brachial index is within normal range. No  evidence of significant left lower extremity arterial disease. The left  toe-brachial index is normal.      ASSESSMENT/PLAN: Walter [name] is a 81 y.o. male presenting with discoloration of  bilateral feet.  The erythema is omnipresent.  His wife stated his feet are always cool to the touch.  ABIs were reviewed demonstrating normal ankle-brachial index bilaterally.  There is some microvascular disease as the toe brachial pressure on the patient's right foot is abnormal.  On physical exam, he had palpable bilateral dorsalis pedis pulses.  Current macrovasculature is normal in bilateral lower extremities.  He does have microvascular disease as a product of longstanding diabetes.  He would benefit from continued statin therapy, as well as ambulating as much as possible.  Saifan was asked to call my office should any wounds develop on his feet that are nonhealing over the course of several weeks or if rest pain develops. He can follow-up with my office as needed.   Broadus John, MD [facility] [phone]

## 2021-10-26 ENCOUNTER — Other Ambulatory Visit: Payer: Self-pay

## 2021-10-26 DIAGNOSIS — I739 Peripheral vascular disease, unspecified: Secondary | ICD-10-CM

## 2021-10-27 ENCOUNTER — Ambulatory Visit: Payer: PPO | Admitting: Vascular Surgery

## 2021-10-27 ENCOUNTER — Other Ambulatory Visit: Payer: Self-pay

## 2021-10-27 ENCOUNTER — Encounter: Payer: Self-pay | Admitting: Vascular Surgery

## 2021-10-27 ENCOUNTER — Ambulatory Visit (HOSPITAL_COMMUNITY)
Admission: RE | Admit: 2021-10-27 | Discharge: 2021-10-27 | Disposition: A | Discharge status: Home / Self Care | Payer: PPO | Source: Ambulatory Visit | Attending: Vascular Surgery | Admitting: Vascular Surgery

## 2021-10-27 VITALS — BP 143/82 | HR 83 | Temp 98.0°F | Resp 20 | Ht 74.0 in | Wt 186.0 lb

## 2021-10-27 DIAGNOSIS — I739 Peripheral vascular disease, unspecified: Secondary | ICD-10-CM

## 2021-10-27 DIAGNOSIS — G63 Polyneuropathy in diseases classified elsewhere: Secondary | ICD-10-CM | POA: Diagnosis not present

## 2021-11-21 DIAGNOSIS — I10 Essential (primary) hypertension: Secondary | ICD-10-CM | POA: Diagnosis not present

## 2021-11-21 DIAGNOSIS — E7800 Pure hypercholesterolemia, unspecified: Secondary | ICD-10-CM | POA: Diagnosis not present

## 2021-11-21 DIAGNOSIS — E1165 Type 2 diabetes mellitus with hyperglycemia: Secondary | ICD-10-CM | POA: Diagnosis not present

## 2021-12-05 ENCOUNTER — Ambulatory Visit: Payer: No Typology Code available for payment source | Admitting: Vascular Surgery

## 2021-12-12 ENCOUNTER — Ambulatory Visit: Payer: No Typology Code available for payment source | Admitting: Vascular Surgery

## 2021-12-19 DIAGNOSIS — G8929 Other chronic pain: Secondary | ICD-10-CM | POA: Diagnosis not present

## 2021-12-19 DIAGNOSIS — M25511 Pain in right shoulder: Secondary | ICD-10-CM | POA: Diagnosis not present

## 2021-12-22 DIAGNOSIS — I10 Essential (primary) hypertension: Secondary | ICD-10-CM | POA: Diagnosis not present

## 2021-12-22 DIAGNOSIS — E1165 Type 2 diabetes mellitus with hyperglycemia: Secondary | ICD-10-CM | POA: Diagnosis not present

## 2021-12-22 DIAGNOSIS — E7800 Pure hypercholesterolemia, unspecified: Secondary | ICD-10-CM | POA: Diagnosis not present

## 2022-01-21 DIAGNOSIS — E1165 Type 2 diabetes mellitus with hyperglycemia: Secondary | ICD-10-CM | POA: Diagnosis not present

## 2022-01-21 DIAGNOSIS — E7800 Pure hypercholesterolemia, unspecified: Secondary | ICD-10-CM | POA: Diagnosis not present

## 2022-01-21 DIAGNOSIS — I10 Essential (primary) hypertension: Secondary | ICD-10-CM | POA: Diagnosis not present

## 2022-04-23 DIAGNOSIS — E7800 Pure hypercholesterolemia, unspecified: Secondary | ICD-10-CM | POA: Diagnosis not present

## 2022-04-23 DIAGNOSIS — E1165 Type 2 diabetes mellitus with hyperglycemia: Secondary | ICD-10-CM | POA: Diagnosis not present

## 2022-04-23 DIAGNOSIS — I10 Essential (primary) hypertension: Secondary | ICD-10-CM | POA: Diagnosis not present

## 2022-06-23 DIAGNOSIS — E1165 Type 2 diabetes mellitus with hyperglycemia: Secondary | ICD-10-CM | POA: Diagnosis not present

## 2022-06-23 DIAGNOSIS — E7800 Pure hypercholesterolemia, unspecified: Secondary | ICD-10-CM | POA: Diagnosis not present

## 2022-06-23 DIAGNOSIS — I10 Essential (primary) hypertension: Secondary | ICD-10-CM | POA: Diagnosis not present

## 2022-07-24 DIAGNOSIS — I10 Essential (primary) hypertension: Secondary | ICD-10-CM | POA: Diagnosis not present

## 2022-07-24 DIAGNOSIS — E7800 Pure hypercholesterolemia, unspecified: Secondary | ICD-10-CM | POA: Diagnosis not present

## 2022-07-24 DIAGNOSIS — E1165 Type 2 diabetes mellitus with hyperglycemia: Secondary | ICD-10-CM | POA: Diagnosis not present

## 2022-10-08 DIAGNOSIS — E7800 Pure hypercholesterolemia, unspecified: Secondary | ICD-10-CM | POA: Diagnosis not present

## 2022-10-08 DIAGNOSIS — Z0000 Encounter for general adult medical examination without abnormal findings: Secondary | ICD-10-CM | POA: Diagnosis not present

## 2022-10-08 DIAGNOSIS — E559 Vitamin D deficiency, unspecified: Secondary | ICD-10-CM | POA: Diagnosis not present

## 2022-10-08 DIAGNOSIS — I10 Essential (primary) hypertension: Secondary | ICD-10-CM | POA: Diagnosis not present

## 2022-10-08 DIAGNOSIS — Z79899 Other long term (current) drug therapy: Secondary | ICD-10-CM | POA: Diagnosis not present

## 2022-10-08 DIAGNOSIS — M549 Dorsalgia, unspecified: Secondary | ICD-10-CM | POA: Diagnosis not present

## 2022-10-08 DIAGNOSIS — G8929 Other chronic pain: Secondary | ICD-10-CM | POA: Diagnosis not present

## 2022-10-08 DIAGNOSIS — Z6822 Body mass index (BMI) 22.0-22.9, adult: Secondary | ICD-10-CM | POA: Diagnosis not present

## 2022-10-08 DIAGNOSIS — G629 Polyneuropathy, unspecified: Secondary | ICD-10-CM | POA: Diagnosis not present

## 2022-10-08 DIAGNOSIS — E1143 Type 2 diabetes mellitus with diabetic autonomic (poly)neuropathy: Secondary | ICD-10-CM | POA: Diagnosis not present

## 2022-10-08 DIAGNOSIS — G4762 Sleep related leg cramps: Secondary | ICD-10-CM | POA: Diagnosis not present

## 2023-01-24 DIAGNOSIS — Z6822 Body mass index (BMI) 22.0-22.9, adult: Secondary | ICD-10-CM | POA: Diagnosis not present

## 2023-01-24 DIAGNOSIS — I739 Peripheral vascular disease, unspecified: Secondary | ICD-10-CM | POA: Diagnosis not present

## 2023-01-24 DIAGNOSIS — E1140 Type 2 diabetes mellitus with diabetic neuropathy, unspecified: Secondary | ICD-10-CM | POA: Diagnosis not present

## 2023-01-24 DIAGNOSIS — Z7689 Persons encountering health services in other specified circumstances: Secondary | ICD-10-CM | POA: Diagnosis not present

## 2023-01-28 DIAGNOSIS — E1140 Type 2 diabetes mellitus with diabetic neuropathy, unspecified: Secondary | ICD-10-CM | POA: Diagnosis not present

## 2023-01-31 DIAGNOSIS — Z789 Other specified health status: Secondary | ICD-10-CM | POA: Diagnosis not present

## 2023-01-31 DIAGNOSIS — E1140 Type 2 diabetes mellitus with diabetic neuropathy, unspecified: Secondary | ICD-10-CM | POA: Diagnosis not present

## 2023-01-31 DIAGNOSIS — I10 Essential (primary) hypertension: Secondary | ICD-10-CM | POA: Diagnosis not present

## 2023-01-31 DIAGNOSIS — Z6822 Body mass index (BMI) 22.0-22.9, adult: Secondary | ICD-10-CM | POA: Diagnosis not present

## 2023-01-31 DIAGNOSIS — E7800 Pure hypercholesterolemia, unspecified: Secondary | ICD-10-CM | POA: Diagnosis not present

## 2023-02-23 DIAGNOSIS — I10 Essential (primary) hypertension: Secondary | ICD-10-CM | POA: Diagnosis not present

## 2023-02-23 DIAGNOSIS — E1140 Type 2 diabetes mellitus with diabetic neuropathy, unspecified: Secondary | ICD-10-CM | POA: Diagnosis not present

## 2023-03-25 DIAGNOSIS — E1140 Type 2 diabetes mellitus with diabetic neuropathy, unspecified: Secondary | ICD-10-CM | POA: Diagnosis not present

## 2023-03-25 DIAGNOSIS — I10 Essential (primary) hypertension: Secondary | ICD-10-CM | POA: Diagnosis not present

## 2023-04-22 DIAGNOSIS — H26493 Other secondary cataract, bilateral: Secondary | ICD-10-CM | POA: Diagnosis not present

## 2023-04-25 DIAGNOSIS — E1140 Type 2 diabetes mellitus with diabetic neuropathy, unspecified: Secondary | ICD-10-CM | POA: Diagnosis not present

## 2023-04-25 DIAGNOSIS — I10 Essential (primary) hypertension: Secondary | ICD-10-CM | POA: Diagnosis not present

## 2023-04-30 DIAGNOSIS — E1140 Type 2 diabetes mellitus with diabetic neuropathy, unspecified: Secondary | ICD-10-CM | POA: Diagnosis not present

## 2023-04-30 DIAGNOSIS — I10 Essential (primary) hypertension: Secondary | ICD-10-CM | POA: Diagnosis not present

## 2023-04-30 DIAGNOSIS — E7800 Pure hypercholesterolemia, unspecified: Secondary | ICD-10-CM | POA: Diagnosis not present

## 2023-05-08 DIAGNOSIS — Z136 Encounter for screening for cardiovascular disorders: Secondary | ICD-10-CM | POA: Diagnosis not present

## 2023-05-08 DIAGNOSIS — Z6821 Body mass index (BMI) 21.0-21.9, adult: Secondary | ICD-10-CM | POA: Diagnosis not present

## 2023-05-08 DIAGNOSIS — I739 Peripheral vascular disease, unspecified: Secondary | ICD-10-CM | POA: Diagnosis not present

## 2023-05-08 DIAGNOSIS — Z0000 Encounter for general adult medical examination without abnormal findings: Secondary | ICD-10-CM | POA: Diagnosis not present

## 2023-05-08 DIAGNOSIS — Z1339 Encounter for screening examination for other mental health and behavioral disorders: Secondary | ICD-10-CM | POA: Diagnosis not present

## 2023-05-08 DIAGNOSIS — E1140 Type 2 diabetes mellitus with diabetic neuropathy, unspecified: Secondary | ICD-10-CM | POA: Diagnosis not present

## 2023-05-08 DIAGNOSIS — Z139 Encounter for screening, unspecified: Secondary | ICD-10-CM | POA: Diagnosis not present

## 2023-05-08 DIAGNOSIS — E7800 Pure hypercholesterolemia, unspecified: Secondary | ICD-10-CM | POA: Diagnosis not present

## 2023-05-08 DIAGNOSIS — Z1331 Encounter for screening for depression: Secondary | ICD-10-CM | POA: Diagnosis not present

## 2023-05-08 DIAGNOSIS — Z1389 Encounter for screening for other disorder: Secondary | ICD-10-CM | POA: Diagnosis not present

## 2023-05-08 DIAGNOSIS — I10 Essential (primary) hypertension: Secondary | ICD-10-CM | POA: Diagnosis not present

## 2023-05-26 DIAGNOSIS — E7800 Pure hypercholesterolemia, unspecified: Secondary | ICD-10-CM | POA: Diagnosis not present

## 2023-05-26 DIAGNOSIS — E1140 Type 2 diabetes mellitus with diabetic neuropathy, unspecified: Secondary | ICD-10-CM | POA: Diagnosis not present

## 2023-06-25 DIAGNOSIS — E1140 Type 2 diabetes mellitus with diabetic neuropathy, unspecified: Secondary | ICD-10-CM | POA: Diagnosis not present

## 2023-06-25 DIAGNOSIS — E7800 Pure hypercholesterolemia, unspecified: Secondary | ICD-10-CM | POA: Diagnosis not present

## 2023-07-26 DIAGNOSIS — E1140 Type 2 diabetes mellitus with diabetic neuropathy, unspecified: Secondary | ICD-10-CM | POA: Diagnosis not present

## 2023-07-26 DIAGNOSIS — E7800 Pure hypercholesterolemia, unspecified: Secondary | ICD-10-CM | POA: Diagnosis not present

## 2023-07-29 DIAGNOSIS — M7052 Other bursitis of knee, left knee: Secondary | ICD-10-CM | POA: Diagnosis not present

## 2023-07-31 DIAGNOSIS — I10 Essential (primary) hypertension: Secondary | ICD-10-CM | POA: Diagnosis not present

## 2023-07-31 DIAGNOSIS — E7800 Pure hypercholesterolemia, unspecified: Secondary | ICD-10-CM | POA: Diagnosis not present

## 2023-07-31 DIAGNOSIS — E1140 Type 2 diabetes mellitus with diabetic neuropathy, unspecified: Secondary | ICD-10-CM | POA: Diagnosis not present

## 2023-08-01 DIAGNOSIS — M65341 Trigger finger, right ring finger: Secondary | ICD-10-CM | POA: Diagnosis not present

## 2023-08-01 DIAGNOSIS — L89322 Pressure ulcer of left buttock, stage 2: Secondary | ICD-10-CM | POA: Diagnosis not present

## 2023-08-01 DIAGNOSIS — Z6821 Body mass index (BMI) 21.0-21.9, adult: Secondary | ICD-10-CM | POA: Diagnosis not present

## 2023-08-01 DIAGNOSIS — Z139 Encounter for screening, unspecified: Secondary | ICD-10-CM | POA: Diagnosis not present

## 2023-08-07 DIAGNOSIS — L89322 Pressure ulcer of left buttock, stage 2: Secondary | ICD-10-CM | POA: Diagnosis not present

## 2023-08-07 DIAGNOSIS — Z6822 Body mass index (BMI) 22.0-22.9, adult: Secondary | ICD-10-CM | POA: Diagnosis not present

## 2023-08-07 DIAGNOSIS — E1140 Type 2 diabetes mellitus with diabetic neuropathy, unspecified: Secondary | ICD-10-CM | POA: Diagnosis not present

## 2023-08-07 DIAGNOSIS — I739 Peripheral vascular disease, unspecified: Secondary | ICD-10-CM | POA: Diagnosis not present

## 2023-08-21 DIAGNOSIS — Z6822 Body mass index (BMI) 22.0-22.9, adult: Secondary | ICD-10-CM | POA: Diagnosis not present

## 2023-08-21 DIAGNOSIS — L89320 Pressure ulcer of left buttock, unstageable: Secondary | ICD-10-CM | POA: Diagnosis not present

## 2023-08-25 DIAGNOSIS — E1140 Type 2 diabetes mellitus with diabetic neuropathy, unspecified: Secondary | ICD-10-CM | POA: Diagnosis not present

## 2023-08-25 DIAGNOSIS — E7800 Pure hypercholesterolemia, unspecified: Secondary | ICD-10-CM | POA: Diagnosis not present

## 2023-09-04 DIAGNOSIS — L89320 Pressure ulcer of left buttock, unstageable: Secondary | ICD-10-CM | POA: Diagnosis not present

## 2023-09-04 DIAGNOSIS — Z6822 Body mass index (BMI) 22.0-22.9, adult: Secondary | ICD-10-CM | POA: Diagnosis not present

## 2023-09-25 DIAGNOSIS — E1140 Type 2 diabetes mellitus with diabetic neuropathy, unspecified: Secondary | ICD-10-CM | POA: Diagnosis not present

## 2023-09-25 DIAGNOSIS — E7800 Pure hypercholesterolemia, unspecified: Secondary | ICD-10-CM | POA: Diagnosis not present

## 2023-10-26 DIAGNOSIS — E1140 Type 2 diabetes mellitus with diabetic neuropathy, unspecified: Secondary | ICD-10-CM | POA: Diagnosis not present

## 2023-10-26 DIAGNOSIS — E7800 Pure hypercholesterolemia, unspecified: Secondary | ICD-10-CM | POA: Diagnosis not present

## 2023-11-07 DIAGNOSIS — E7800 Pure hypercholesterolemia, unspecified: Secondary | ICD-10-CM | POA: Diagnosis not present

## 2023-11-07 DIAGNOSIS — E1140 Type 2 diabetes mellitus with diabetic neuropathy, unspecified: Secondary | ICD-10-CM | POA: Diagnosis not present

## 2023-11-18 DIAGNOSIS — E7800 Pure hypercholesterolemia, unspecified: Secondary | ICD-10-CM | POA: Diagnosis not present

## 2023-11-18 DIAGNOSIS — Z6821 Body mass index (BMI) 21.0-21.9, adult: Secondary | ICD-10-CM | POA: Diagnosis not present

## 2023-11-18 DIAGNOSIS — I10 Essential (primary) hypertension: Secondary | ICD-10-CM | POA: Diagnosis not present

## 2023-11-18 DIAGNOSIS — E1140 Type 2 diabetes mellitus with diabetic neuropathy, unspecified: Secondary | ICD-10-CM | POA: Diagnosis not present

## 2023-11-23 DIAGNOSIS — E1140 Type 2 diabetes mellitus with diabetic neuropathy, unspecified: Secondary | ICD-10-CM | POA: Diagnosis not present

## 2023-11-23 DIAGNOSIS — E7800 Pure hypercholesterolemia, unspecified: Secondary | ICD-10-CM | POA: Diagnosis not present

## 2023-12-10 DIAGNOSIS — Z20822 Contact with and (suspected) exposure to covid-19: Secondary | ICD-10-CM | POA: Diagnosis not present

## 2023-12-10 DIAGNOSIS — R059 Cough, unspecified: Secondary | ICD-10-CM | POA: Diagnosis not present

## 2023-12-10 DIAGNOSIS — J069 Acute upper respiratory infection, unspecified: Secondary | ICD-10-CM | POA: Diagnosis not present

## 2023-12-10 DIAGNOSIS — Z6821 Body mass index (BMI) 21.0-21.9, adult: Secondary | ICD-10-CM | POA: Diagnosis not present

## 2024-01-23 DIAGNOSIS — I10 Essential (primary) hypertension: Secondary | ICD-10-CM | POA: Diagnosis not present

## 2024-01-23 DIAGNOSIS — E1140 Type 2 diabetes mellitus with diabetic neuropathy, unspecified: Secondary | ICD-10-CM | POA: Diagnosis not present

## 2024-02-11 DIAGNOSIS — E7800 Pure hypercholesterolemia, unspecified: Secondary | ICD-10-CM | POA: Diagnosis not present

## 2024-02-11 DIAGNOSIS — E1140 Type 2 diabetes mellitus with diabetic neuropathy, unspecified: Secondary | ICD-10-CM | POA: Diagnosis not present

## 2024-02-11 DIAGNOSIS — I10 Essential (primary) hypertension: Secondary | ICD-10-CM | POA: Diagnosis not present

## 2024-02-23 DIAGNOSIS — I10 Essential (primary) hypertension: Secondary | ICD-10-CM | POA: Diagnosis not present

## 2024-02-23 DIAGNOSIS — E1140 Type 2 diabetes mellitus with diabetic neuropathy, unspecified: Secondary | ICD-10-CM | POA: Diagnosis not present

## 2024-02-25 DIAGNOSIS — Z1331 Encounter for screening for depression: Secondary | ICD-10-CM | POA: Diagnosis not present

## 2024-02-25 DIAGNOSIS — E1140 Type 2 diabetes mellitus with diabetic neuropathy, unspecified: Secondary | ICD-10-CM | POA: Diagnosis not present

## 2024-02-25 DIAGNOSIS — Z139 Encounter for screening, unspecified: Secondary | ICD-10-CM | POA: Diagnosis not present

## 2024-02-25 DIAGNOSIS — Z0000 Encounter for general adult medical examination without abnormal findings: Secondary | ICD-10-CM | POA: Diagnosis not present

## 2024-02-25 DIAGNOSIS — E669 Obesity, unspecified: Secondary | ICD-10-CM | POA: Diagnosis not present

## 2024-02-25 DIAGNOSIS — Z6820 Body mass index (BMI) 20.0-20.9, adult: Secondary | ICD-10-CM | POA: Diagnosis not present

## 2024-02-25 DIAGNOSIS — I10 Essential (primary) hypertension: Secondary | ICD-10-CM | POA: Diagnosis not present

## 2024-02-25 DIAGNOSIS — Z1339 Encounter for screening examination for other mental health and behavioral disorders: Secondary | ICD-10-CM | POA: Diagnosis not present

## 2024-03-03 DIAGNOSIS — R4189 Other symptoms and signs involving cognitive functions and awareness: Secondary | ICD-10-CM | POA: Diagnosis not present

## 2024-03-03 DIAGNOSIS — R4182 Altered mental status, unspecified: Secondary | ICD-10-CM | POA: Diagnosis not present

## 2024-03-03 DIAGNOSIS — Z6820 Body mass index (BMI) 20.0-20.9, adult: Secondary | ICD-10-CM | POA: Diagnosis not present

## 2024-03-24 DIAGNOSIS — E1140 Type 2 diabetes mellitus with diabetic neuropathy, unspecified: Secondary | ICD-10-CM | POA: Diagnosis not present

## 2024-03-24 DIAGNOSIS — I10 Essential (primary) hypertension: Secondary | ICD-10-CM | POA: Diagnosis not present

## 2024-04-24 DIAGNOSIS — E1140 Type 2 diabetes mellitus with diabetic neuropathy, unspecified: Secondary | ICD-10-CM | POA: Diagnosis not present

## 2024-04-24 DIAGNOSIS — I10 Essential (primary) hypertension: Secondary | ICD-10-CM | POA: Diagnosis not present

## 2024-04-29 ENCOUNTER — Other Ambulatory Visit (HOSPITAL_BASED_OUTPATIENT_CLINIC_OR_DEPARTMENT_OTHER): Payer: Self-pay

## 2024-05-25 DIAGNOSIS — I10 Essential (primary) hypertension: Secondary | ICD-10-CM | POA: Diagnosis not present

## 2024-05-25 DIAGNOSIS — E1140 Type 2 diabetes mellitus with diabetic neuropathy, unspecified: Secondary | ICD-10-CM | POA: Diagnosis not present

## 2024-06-05 DIAGNOSIS — R159 Full incontinence of feces: Secondary | ICD-10-CM | POA: Diagnosis not present

## 2024-06-05 DIAGNOSIS — Z6821 Body mass index (BMI) 21.0-21.9, adult: Secondary | ICD-10-CM | POA: Diagnosis not present

## 2024-06-24 DIAGNOSIS — E1140 Type 2 diabetes mellitus with diabetic neuropathy, unspecified: Secondary | ICD-10-CM | POA: Diagnosis not present

## 2024-06-24 DIAGNOSIS — I10 Essential (primary) hypertension: Secondary | ICD-10-CM | POA: Diagnosis not present

## 2024-08-11 DIAGNOSIS — R159 Full incontinence of feces: Secondary | ICD-10-CM | POA: Diagnosis not present

## 2024-08-11 DIAGNOSIS — Z6821 Body mass index (BMI) 21.0-21.9, adult: Secondary | ICD-10-CM | POA: Diagnosis not present

## 2024-08-11 DIAGNOSIS — M7130 Other bursal cyst, unspecified site: Secondary | ICD-10-CM | POA: Diagnosis not present

## 2024-08-24 DIAGNOSIS — E1140 Type 2 diabetes mellitus with diabetic neuropathy, unspecified: Secondary | ICD-10-CM | POA: Diagnosis not present

## 2024-08-24 DIAGNOSIS — I10 Essential (primary) hypertension: Secondary | ICD-10-CM | POA: Diagnosis not present

## 2024-08-26 DIAGNOSIS — E7800 Pure hypercholesterolemia, unspecified: Secondary | ICD-10-CM | POA: Diagnosis not present

## 2024-08-26 DIAGNOSIS — E1140 Type 2 diabetes mellitus with diabetic neuropathy, unspecified: Secondary | ICD-10-CM | POA: Diagnosis not present

## 2024-08-26 DIAGNOSIS — Z1321 Encounter for screening for nutritional disorder: Secondary | ICD-10-CM | POA: Diagnosis not present
